# Patient Record
Sex: Male | Born: 1955 | Race: White | Hispanic: No | Smoking: Never smoker
Health system: Southern US, Community
[De-identification: ages and names within clinical notes are randomized; demographics above are authoritative.]

## PROBLEM LIST (undated history)

## (undated) DIAGNOSIS — I10 Essential (primary) hypertension: Secondary | ICD-10-CM

## (undated) DIAGNOSIS — I619 Nontraumatic intracerebral hemorrhage, unspecified: Secondary | ICD-10-CM

## (undated) HISTORY — PX: APPENDECTOMY: SHX54

## (undated) HISTORY — PX: HERNIA REPAIR: SHX51

## (undated) HISTORY — PX: BURR HOLE: SHX908

---

## 1997-05-10 DIAGNOSIS — I639 Cerebral infarction, unspecified: Secondary | ICD-10-CM

## 1997-05-10 HISTORY — DX: Cerebral infarction, unspecified: I63.9

## 1998-01-04 ENCOUNTER — Inpatient Hospital Stay (HOSPITAL_COMMUNITY): Admission: AD | Admit: 1998-01-04 | Discharge: 1998-01-14 | Payer: Self-pay | Admitting: Emergency Medicine

## 1998-01-10 ENCOUNTER — Encounter: Payer: Self-pay | Admitting: Neurosurgery

## 1998-01-14 ENCOUNTER — Inpatient Hospital Stay (HOSPITAL_COMMUNITY)
Admission: RE | Admit: 1998-01-14 | Discharge: 1998-01-24 | Payer: Self-pay | Admitting: Physical Medicine and Rehabilitation

## 1998-01-17 ENCOUNTER — Encounter: Payer: Self-pay | Admitting: Physical Medicine and Rehabilitation

## 1998-01-28 ENCOUNTER — Encounter
Admission: RE | Admit: 1998-01-28 | Discharge: 1998-04-28 | Payer: Self-pay | Admitting: Physical Medicine and Rehabilitation

## 1999-10-23 ENCOUNTER — Ambulatory Visit (HOSPITAL_COMMUNITY): Admission: RE | Admit: 1999-10-23 | Discharge: 1999-10-23 | Payer: Self-pay | Admitting: *Deleted

## 1999-10-23 ENCOUNTER — Encounter: Payer: Self-pay | Admitting: *Deleted

## 2000-04-03 ENCOUNTER — Encounter: Payer: Self-pay | Admitting: *Deleted

## 2000-04-03 ENCOUNTER — Ambulatory Visit (HOSPITAL_COMMUNITY): Admission: RE | Admit: 2000-04-03 | Discharge: 2000-04-03 | Payer: Self-pay | Admitting: *Deleted

## 2001-01-18 ENCOUNTER — Emergency Department (HOSPITAL_COMMUNITY): Admission: EM | Admit: 2001-01-18 | Discharge: 2001-01-18 | Payer: Self-pay | Admitting: Emergency Medicine

## 2001-01-18 ENCOUNTER — Encounter: Payer: Self-pay | Admitting: Emergency Medicine

## 2002-09-04 ENCOUNTER — Inpatient Hospital Stay (HOSPITAL_COMMUNITY): Admission: EM | Admit: 2002-09-04 | Discharge: 2002-09-05 | Payer: Self-pay

## 2002-09-04 ENCOUNTER — Encounter (INDEPENDENT_AMBULATORY_CARE_PROVIDER_SITE_OTHER): Payer: Self-pay | Admitting: *Deleted

## 2003-01-17 ENCOUNTER — Encounter: Payer: Self-pay | Admitting: Family Medicine

## 2003-01-17 ENCOUNTER — Encounter: Admission: RE | Admit: 2003-01-17 | Discharge: 2003-01-17 | Payer: Self-pay | Admitting: Family Medicine

## 2003-06-28 ENCOUNTER — Ambulatory Visit (HOSPITAL_BASED_OUTPATIENT_CLINIC_OR_DEPARTMENT_OTHER): Admission: RE | Admit: 2003-06-28 | Discharge: 2003-06-28 | Payer: Self-pay | Admitting: General Surgery

## 2003-06-28 ENCOUNTER — Ambulatory Visit (HOSPITAL_COMMUNITY): Admission: RE | Admit: 2003-06-28 | Discharge: 2003-06-28 | Payer: Self-pay | Admitting: General Surgery

## 2007-05-19 ENCOUNTER — Encounter: Admission: RE | Admit: 2007-05-19 | Discharge: 2007-05-19 | Payer: Self-pay | Admitting: Family Medicine

## 2007-05-19 IMAGING — CT CT HEAD W/O CM
1 series · 16 of 30 positions shown, 20 images · IV contrast (agent unspecified)
Comparison: None.

CLINICAL DATA: Headaches.  Hypertension.  Prior cerebral hemorrhage in [NA].
 HEAD CT WITHOUT CONTRAST:
TECHNIQUE: Contiguous axial images were obtained from the base of the skull through the vertex according to standard protocol without contrast.

[Series 32: 3d filtered head · axial · 0.49mm/px · z∈[+0,+145]mm · 16 of 32 slices shown, 20 images]
[im 2/32  brain]
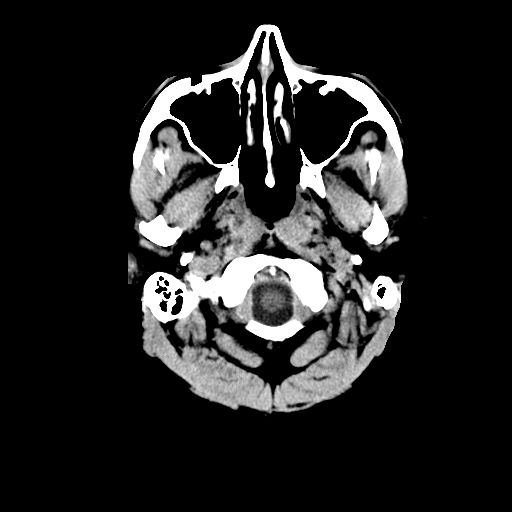
[im 2/32  bone]
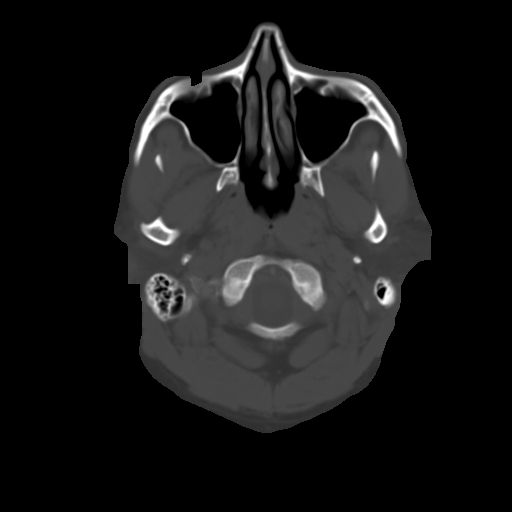
[im 4/32  brain]
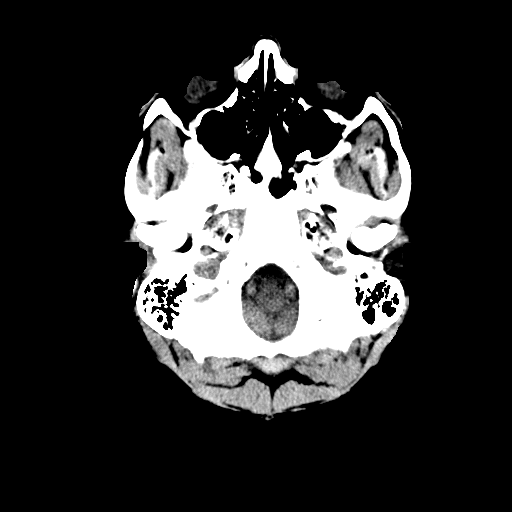
[im 6/32  brain]
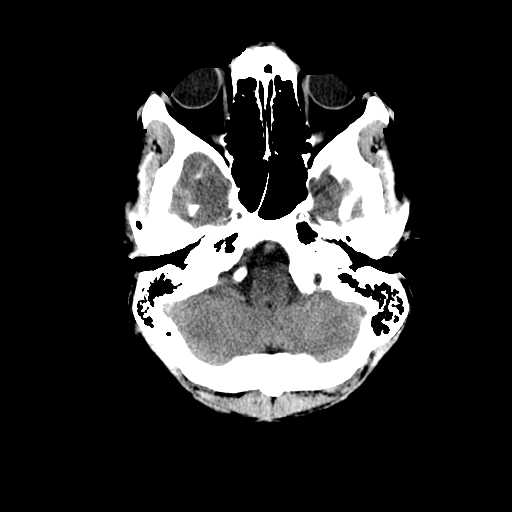
[im 8/32  brain]
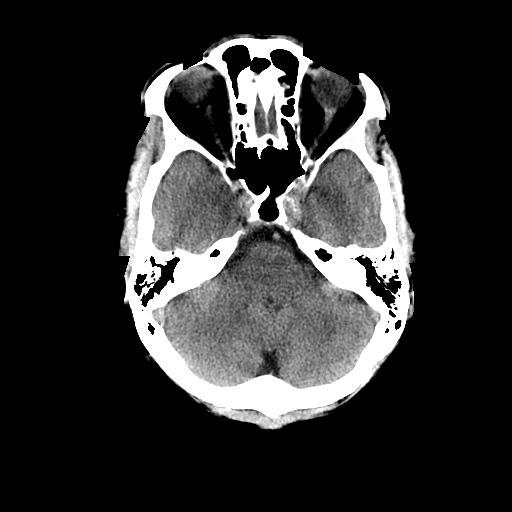
[im 9/32  brain]
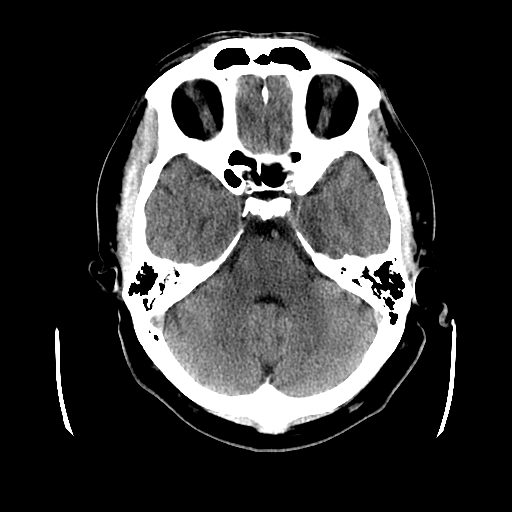
[im 9/32  bone]
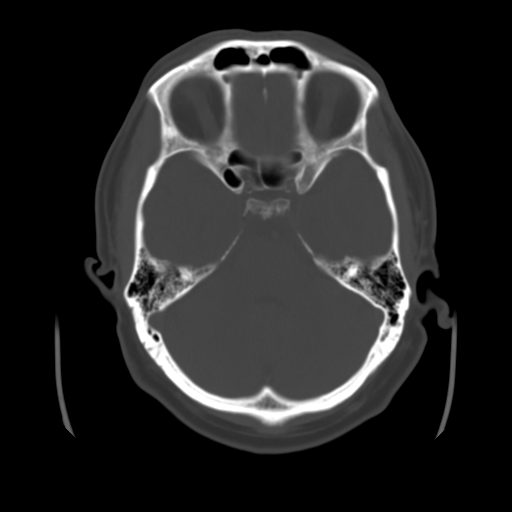
[im 11/32  brain]
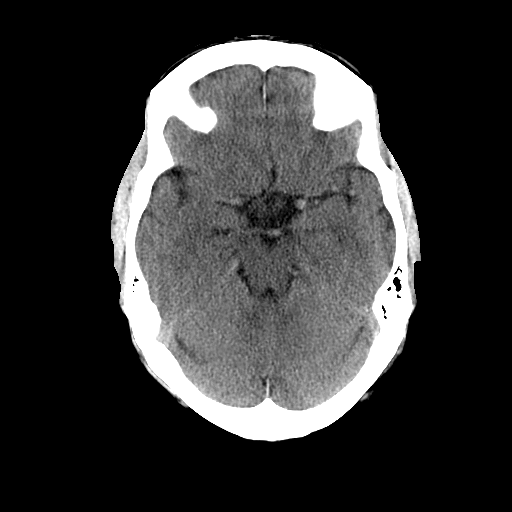
[im 13/32  brain]
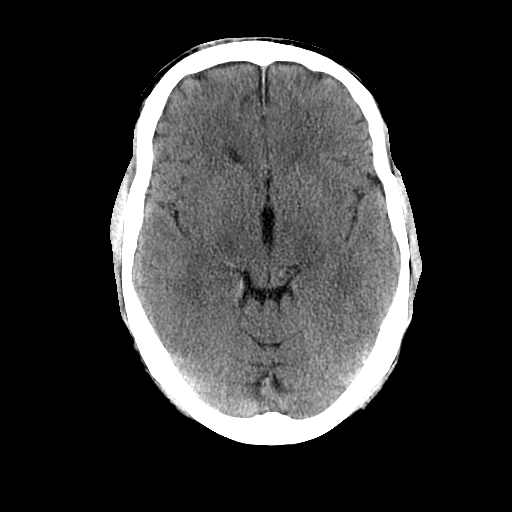
[im 15/32  brain]
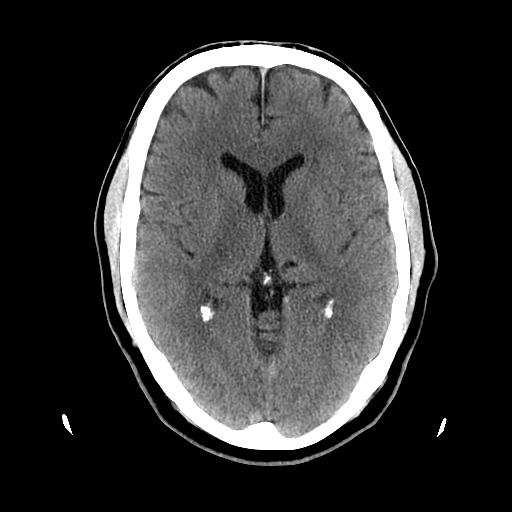
[im 17/32  brain]
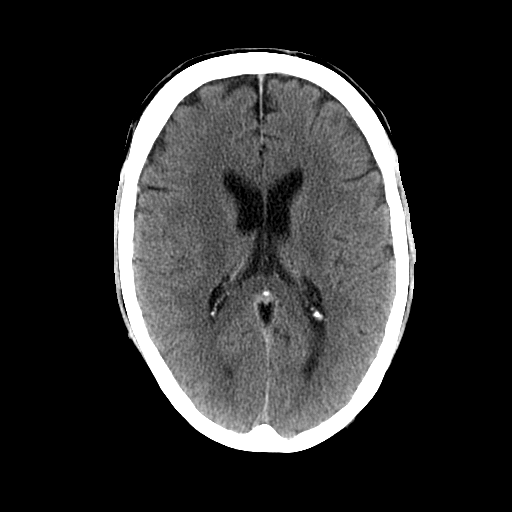
[im 17/32  bone]
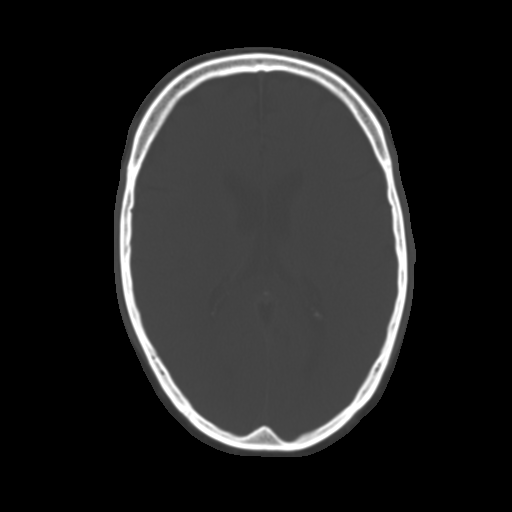
[im 19/32  brain]
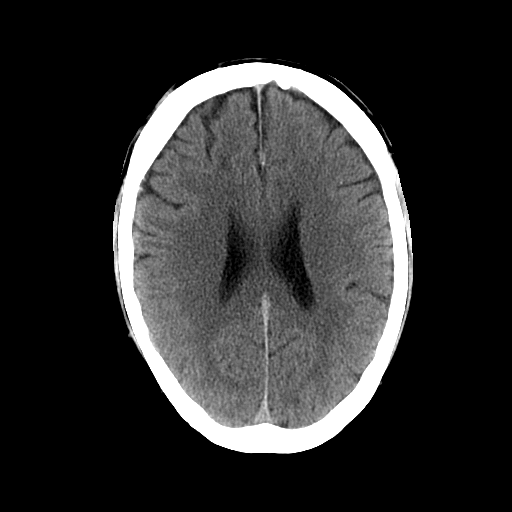
[im 21/32  brain]
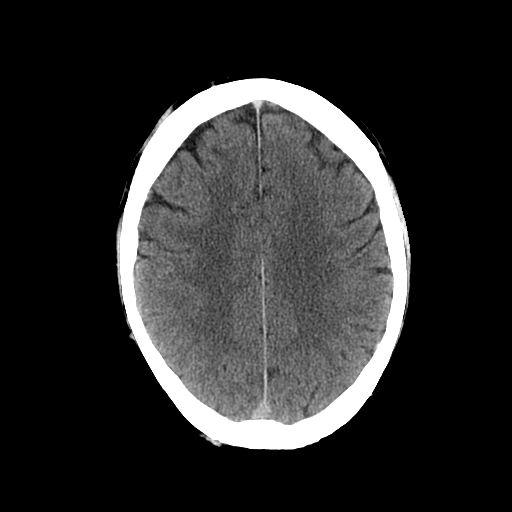
[im 23/32  brain]
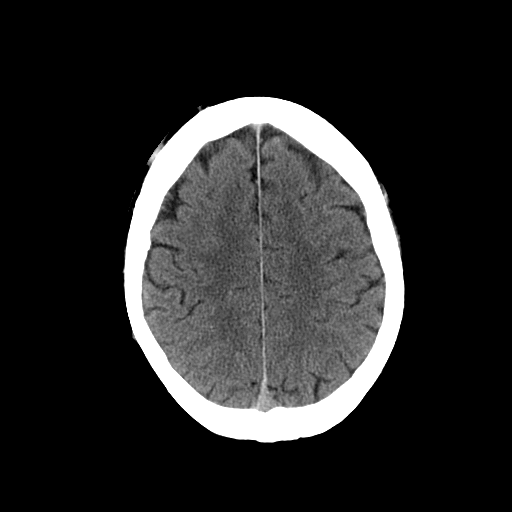
[im 24/32  brain]
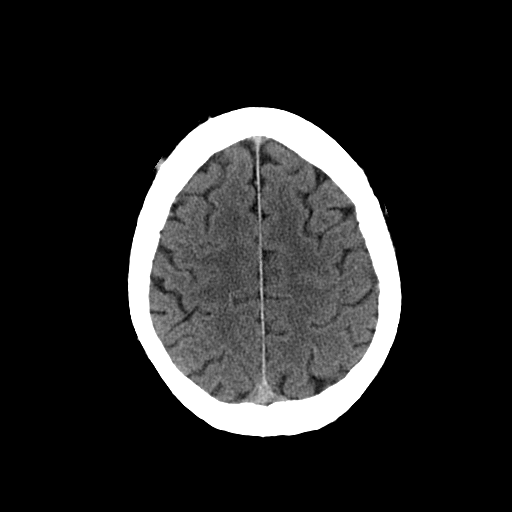
[im 24/32  bone]
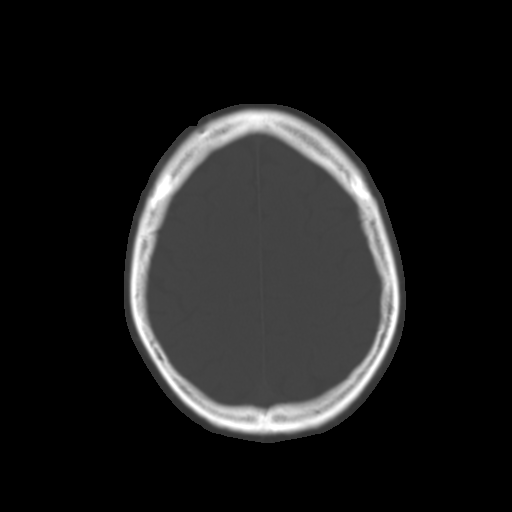
[im 26/32  brain]
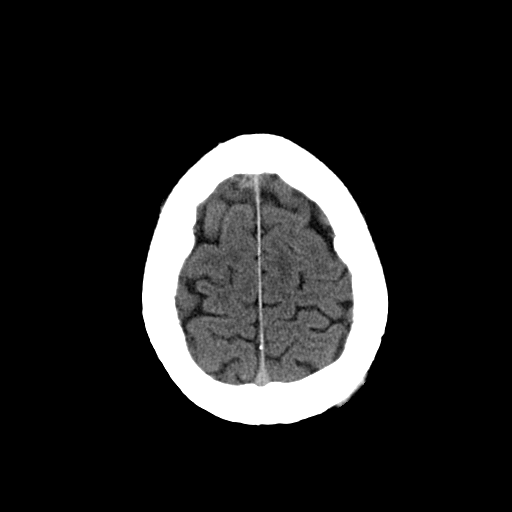
[im 28/32  brain]
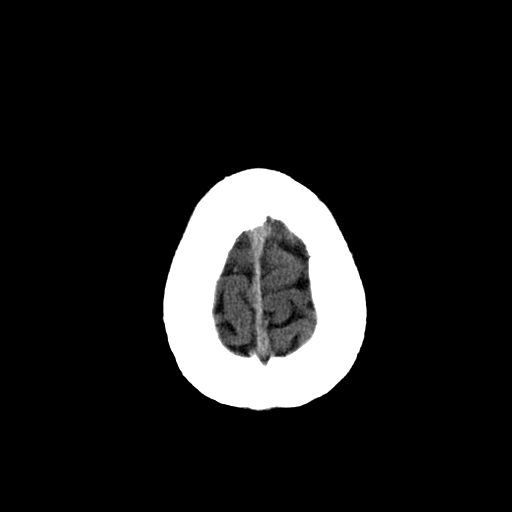
[im 30/32  brain]
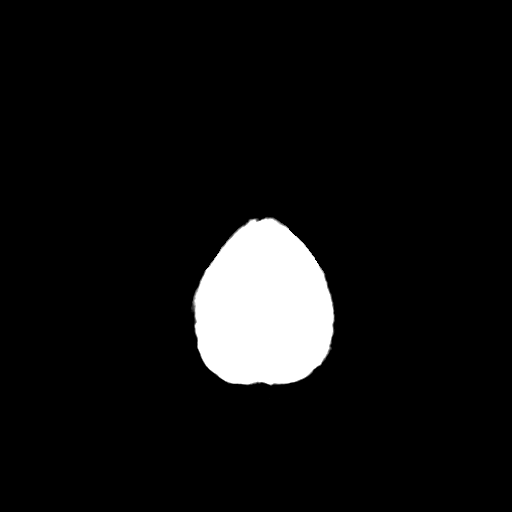

[16 of 30 positions shown; findings below may reference images not displayed]

FINDINGS: The ventricular system is normal in size and configuration, and the septum is in a normal midline position.  The fourth ventricle and basilar cisterns appear normal.  No blood, edema, or mass effect is seen.  Previously noted low attenuation in the left thalamus is again noted, as was described in a dictation from a CT brain can of [DATE], which is not available.  On bone window images, no acute bony abnormality is seen. The paranasal sinuses are clear.
IMPRESSION: No acute intracranial abnormality.

## 2007-08-25 ENCOUNTER — Ambulatory Visit: Payer: Self-pay | Admitting: Gastroenterology

## 2007-09-06 ENCOUNTER — Telehealth: Payer: Self-pay | Admitting: Gastroenterology

## 2007-09-08 ENCOUNTER — Ambulatory Visit: Payer: Self-pay | Admitting: Gastroenterology

## 2007-09-08 ENCOUNTER — Encounter: Payer: Self-pay | Admitting: Gastroenterology

## 2007-09-14 ENCOUNTER — Encounter: Payer: Self-pay | Admitting: Gastroenterology

## 2007-09-22 ENCOUNTER — Telehealth: Payer: Self-pay | Admitting: Gastroenterology

## 2008-02-28 ENCOUNTER — Telehealth: Payer: Self-pay | Admitting: Gastroenterology

## 2008-03-11 ENCOUNTER — Telehealth: Payer: Self-pay | Admitting: Gastroenterology

## 2008-03-25 ENCOUNTER — Telehealth: Payer: Self-pay | Admitting: Gastroenterology

## 2008-04-01 ENCOUNTER — Encounter: Payer: Self-pay | Admitting: Gastroenterology

## 2008-04-01 ENCOUNTER — Ambulatory Visit: Payer: Self-pay | Admitting: Gastroenterology

## 2008-04-07 ENCOUNTER — Encounter: Payer: Self-pay | Admitting: Gastroenterology

## 2008-10-24 ENCOUNTER — Telehealth: Payer: Self-pay | Admitting: Gastroenterology

## 2008-12-16 ENCOUNTER — Telehealth: Payer: Self-pay | Admitting: Gastroenterology

## 2008-12-23 ENCOUNTER — Telehealth: Payer: Self-pay | Admitting: Gastroenterology

## 2010-09-25 NOTE — H&P (Signed)
NAME:  Blake Sullivan, Blake Sullivan                       ACCOUNT NO.:  [id number]   MEDICAL RECORD NO.:  [id number]                   PATIENT TYPE:  EMS   LOCATION:  [facility]                                 FACILITY:  [facility]   PHYSICIAN:  Gabrielle Dare. Janee Morn, M.D.             DATE OF BIRTH:  08/10/1955   DATE OF ADMISSION:  09/04/2002  DATE OF DISCHARGE:                                HISTORY & PHYSICAL   ADMISSION DIAGNOSIS:  Acute appendicitis.   HISTORY OF PRESENT ILLNESS:  The patient is a 55 year old male with a  history of thalamic subarachnoid hemorrhage in 1999, who developed some  pelvic abdominal pain starting yesterday in the morning and gradually  increased throughout the day.  He was able to eat some yesterday with no  problems, but the pain became so severe around midnight he could no longer  sleep persisting and he came to the emergency room for evaluation.  Workup  in the emergency department included a CT scan with acute appendicitis.  Currently, the patient is still complaining of severe right lower quadrant  pain, but denying any change in his neurologic baseline.   PAST MEDICAL HISTORY:  1. 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  2. Hypertension.   PAST SURGICAL HISTORY:  Intracerebral drain placement by Dr. Phoebe Perch during  the acute phase of his hemorrhagic event, but no prolonged shunt was  required and no drain remained.   FAMILY HISTORY:  Maternal grandmother has lung CA.  Denies any other  history.   MEDICATIONS:  1. Altace 5 mg q.d.  2. Xanax 2.5 mg p.o. q.d.  3. Neurontin 900 mg t.i.d. to q.i.d.   ALLERGIES:  VICODIN causes skin crawling.   REVIEW OF SYMPTOMS:  GENERAL:  He felt thirsty in the middle of the week.  PULMONARY:  Without complaints.  CARDIOVASCULAR:  Without complaints.  GASTROINTESTINAL:  See history of present illness.  NEUROLOGIC:  He  complains of some residual pain in his right arm and some sensation changes  in  the right side of his face which have been stable for the past several  years, status post his subarachnoid hemorrhage.   PHYSICAL EXAMINATION:  VITAL SIGNS:  Blood pressure 134/85, respirations 16,  pulse 68, temperature 98.2.  GENERAL:  Awake, alert, in no acute distress.  Pupils equal round and  reactive to light.  NECK:  Supple without palpable adenopathy.  LUNGS:  Clear to auscultation bilaterally.  HEART:  Regular rate and rhythm and his point of maximal impulse is palpable  in the left chest.  ABDOMEN:  Tenderness to palpation across the lower midline, especially in  the right lower quadrant with voluntary guarding and peritoneal signs.  He  has an equivocal Rovsing sign.  GROIN:  Demonstrates a spontaneously reducing right inguinal hernia.  There  is no left inguinal hernia.   LABORATORY DATA AND X-RAY FINDINGS:  Urinalysis was negative for  acute  infection.  White blood cell count 10.3, hemoglobin 15.5, hematocrit 46.2.  Electrolyte panel was also performed in the emergency department which was  within normal limits including a lipase of 22 and normal liver function  tests.   IMPRESSION:  Acute appendicitis.   PLAN:  Proceed to the operating room for appendectomy.  The procedure  including its risks and benefits were explained to the patient in full.  He  is eager to proceed with the operation and in the interim, we will give him  some IV fluids and IV antibiotics.                                               Gabrielle Dare Janee Morn, M.D.    BET/MEDQ  D:  09/04/2002  T:  09/04/2002  Job:  [id number]

## 2010-09-25 NOTE — Op Note (Signed)
NAME:  Blake Sullivan, Blake Sullivan                       ACCOUNT NO.:  [id number]   MEDICAL RECORD NO.:  [id number]                   PATIENT TYPE:  INP   LOCATION:  1827                                 FACILITY:  [facility]   PHYSICIAN:  Gabrielle Dare. Janee Morn, M.D.             DATE OF BIRTH:  26-Feb-1956   DATE OF PROCEDURE:  09/04/2002  DATE OF DISCHARGE:                                 OPERATIVE REPORT   PREOPERATIVE DIAGNOSIS:  Acute appendicitis.   POSTOPERATIVE DIAGNOSIS:  Acute appendicitis.   PROCEDURE:  Laparoscopic appendectomy.   SURGEON:  Gabrielle Dare. Janee Morn, M.D.   ANESTHESIA:  General.   COMPLICATIONS:  None.   CLINICAL HISTORY:  The patient is a 55 year old male with a history of a  thalamic subarachnoid hemorrhage in 1999, who has nearly completely  recovered from that but developed increasing abdominal pain starting  yesterday morning.  Workup and evaluation in the emergency department was  consistent with acute appendicitis, including CT scan demonstrating this  finding, and he was brought to the operating room for an emergent  appendectomy.   DESCRIPTION OF PROCEDURE:  After informed consent was obtained, the patient  was brought to the operating room.  He had received intravenous antibiotics.  General anesthesia was administered.  His abdomen was prepped and draped in  a sterile fashion.  A curvilinear infraumbilical incision was made, the  subcutaneous tissues were dissected down revealing the external fascia,  which was incised sharply.  The peritoneal cavity was then entered with  great care under direct vision.  Subsequently a 0 Vicryl pursestring suture  was placed around the fascial opening and the Hasson trocar was inserted  into the abdomen and the abdomen was insufflated with carbon dioxide in the  standard fashion and the laparoscopic camera was inserted and the abdomen  was explored, revealing some inflammation visible around the base of the  appendix.   Subsequently a 12 mm lower midline trocar was placed and a 5 mm  right upper quadrant trocar was placed, both under direct vision.  Note that  0.25% Marcaine with epinephrine was used for local anesthetic at all port  sites.  Subsequently the tip of the appendix was located.  The appendix was  extremely inflamed with some small patchy areas of necrosis but was not  perforated, and there was no significant fluid in the abdomen, only a very  small amount of cloudy fluid.  The appendix was retracted superiorly and it  was dissected free from some retroperitoneal attachments along the bottom of  the right gutter, and subsequently the mesoappendix was dissected with a  nice window and the mesoappendix was divided with the vascular load on the  endoscopic GIA stapler.  The mesoappendix was quite long, and this appendix  was fairly long.  Three loads were actually required.  Once this was  accomplished, the base of the appendix, which was healthy, was divided with  the  GIA stapler with the blue tissue load.  Once this was accomplished, the  base of the cecum was inspected and there was good integrity of the staple  line.  No bleeding was noted from the area of the mesoappendix.  The  appendix itself was placed in an EndoCatch bag and removed through the lower  midline incision site.  At this time the abdomen was copiously irrigated and  rechecked for any evidence of bleeding or other problems, and none were  noted.  The trocars were removed under direct vision and the  pneumoperitoneum was released.  The umbilical fascia was closed by tying the  pursestring Vicryl suture.  All three incisions were copiously irrigated and  subsequently  closed with a running 4-0 Vicryl subcuticular stitch.  Sponge, needle, and  instrument counts were all correct.  Benzoin and Steri-Strips and sterile  dressings were applied.  The patient tolerated the procedure well without  any apparent complication, was taken to  the recovery room in stable  condition.                                               Gabrielle Dare Janee Morn, M.D.    BET/MEDQ  D:  09/04/2002  T:  09/05/2002  Job:  [id number]

## 2011-03-19 ENCOUNTER — Encounter: Payer: Self-pay | Admitting: Gastroenterology

## 2012-01-28 ENCOUNTER — Encounter: Payer: Self-pay | Admitting: Gastroenterology

## 2014-09-03 ENCOUNTER — Encounter: Payer: Self-pay | Admitting: Gastroenterology

## 2019-03-23 ENCOUNTER — Emergency Department (HOSPITAL_COMMUNITY): Payer: Medicare Other

## 2019-03-23 ENCOUNTER — Encounter (HOSPITAL_COMMUNITY): Payer: Self-pay | Admitting: Emergency Medicine

## 2019-03-23 ENCOUNTER — Emergency Department (HOSPITAL_COMMUNITY)
Admission: EM | Admit: 2019-03-23 | Discharge: 2019-03-24 | Disposition: A | Discharge status: Home / Self Care | Payer: Medicare Other | Attending: Emergency Medicine | Admitting: Emergency Medicine

## 2019-03-23 DIAGNOSIS — Z79899 Other long term (current) drug therapy: Secondary | ICD-10-CM | POA: Diagnosis not present

## 2019-03-23 DIAGNOSIS — J9811 Atelectasis: Secondary | ICD-10-CM | POA: Insufficient documentation

## 2019-03-23 DIAGNOSIS — R42 Dizziness and giddiness: Secondary | ICD-10-CM

## 2019-03-23 DIAGNOSIS — Q282 Arteriovenous malformation of cerebral vessels: Secondary | ICD-10-CM | POA: Insufficient documentation

## 2019-03-23 DIAGNOSIS — Q2546 Tortuous aortic arch: Secondary | ICD-10-CM | POA: Diagnosis not present

## 2019-03-23 DIAGNOSIS — I10 Essential (primary) hypertension: Secondary | ICD-10-CM | POA: Insufficient documentation

## 2019-03-23 DIAGNOSIS — F10920 Alcohol use, unspecified with intoxication, uncomplicated: Secondary | ICD-10-CM | POA: Diagnosis not present

## 2019-03-23 DIAGNOSIS — F1290 Cannabis use, unspecified, uncomplicated: Secondary | ICD-10-CM | POA: Insufficient documentation

## 2019-03-23 DIAGNOSIS — Z8673 Personal history of transient ischemic attack (TIA), and cerebral infarction without residual deficits: Secondary | ICD-10-CM | POA: Insufficient documentation

## 2019-03-23 DIAGNOSIS — R519 Headache, unspecified: Secondary | ICD-10-CM

## 2019-03-23 HISTORY — DX: Essential (primary) hypertension: I10

## 2019-03-23 HISTORY — DX: Nontraumatic intracerebral hemorrhage, unspecified: I61.9

## 2019-03-23 LAB — I-STAT CHEM 8, ED
BUN: 21 mg/dL (ref 8–23)
Calcium, Ion: 1.21 mmol/L (ref 1.15–1.40)
Chloride: 104 mmol/L (ref 98–111)
Creatinine, Ser: 1.2 mg/dL (ref 0.61–1.24)
Glucose, Bld: 93 mg/dL (ref 70–99)
HCT: 45 % (ref 39.0–52.0)
Hemoglobin: 15.3 g/dL (ref 13.0–17.0)
Potassium: 4.5 mmol/L (ref 3.5–5.1)
Sodium: 140 mmol/L (ref 135–145)
TCO2: 25 mmol/L (ref 22–32)

## 2019-03-23 LAB — COMPREHENSIVE METABOLIC PANEL
ALT: 25 U/L (ref 0–44)
AST: 32 U/L (ref 15–41)
Albumin: 4.1 g/dL (ref 3.5–5.0)
Alkaline Phosphatase: 69 U/L (ref 38–126)
Anion gap: 9 (ref 5–15)
BUN: 18 mg/dL (ref 8–23)
CO2: 23 mmol/L (ref 22–32)
Calcium: 9.2 mg/dL (ref 8.9–10.3)
Chloride: 102 mmol/L (ref 98–111)
Creatinine, Ser: 1.27 mg/dL — ABNORMAL HIGH (ref 0.61–1.24)
GFR calc Af Amer: >60 mL/min (ref >60)
GFR calc non Af Amer: 60 mL/min — ABNORMAL LOW (ref >60)
Glucose, Bld: 97 mg/dL (ref 70–99)
Potassium: 4.5 mmol/L (ref 3.5–5.1)
Sodium: 134 mmol/L — ABNORMAL LOW (ref 135–145)
Total Bilirubin: 0.9 mg/dL (ref 0.3–1.2)
Total Protein: 6.3 g/dL — ABNORMAL LOW (ref 6.5–8.1)

## 2019-03-23 LAB — DIFFERENTIAL
Abs Immature Granulocytes: 0.05 10*3/uL (ref 0.00–0.07)
Basophils Absolute: 0.1 10*3/uL (ref 0.0–0.1)
Basophils Relative: 1 %
Eosinophils Absolute: 0.1 10*3/uL (ref 0.0–0.5)
Eosinophils Relative: 1 %
Immature Granulocytes: 1 %
Lymphocytes Relative: 24 %
Lymphs Abs: 1.6 10*3/uL (ref 0.7–4.0)
Monocytes Absolute: 0.7 10*3/uL (ref 0.1–1.0)
Monocytes Relative: 10 %
Neutro Abs: 4.3 10*3/uL (ref 1.7–7.7)
Neutrophils Relative %: 63 %

## 2019-03-23 LAB — RAPID URINE DRUG SCREEN, HOSP PERFORMED
Amphetamines: NOT DETECTED
Barbiturates: NOT DETECTED
Benzodiazepines: NOT DETECTED
Cocaine: NOT DETECTED
Opiates: NOT DETECTED
Tetrahydrocannabinol: POSITIVE — AB

## 2019-03-23 LAB — TROPONIN I (HIGH SENSITIVITY): Troponin I (High Sensitivity): 3 ng/L (ref <18)

## 2019-03-23 LAB — APTT: aPTT: 28 seconds (ref 24–36)

## 2019-03-23 LAB — ETHANOL: Alcohol, Ethyl (B): <10 mg/dL (ref <10)

## 2019-03-23 LAB — CBC
HCT: 44.6 % (ref 39.0–52.0)
Hemoglobin: 14.8 g/dL (ref 13.0–17.0)
MCH: 30.8 pg (ref 26.0–34.0)
MCHC: 33.2 g/dL (ref 30.0–36.0)
MCV: 92.9 fL (ref 80.0–100.0)
Platelets: 167 10*3/uL (ref 150–400)
RBC: 4.8 MIL/uL (ref 4.22–5.81)
RDW: 13 % (ref 11.5–15.5)
WBC: 6.8 10*3/uL (ref 4.0–10.5)
nRBC: 0 % (ref 0.0–0.2)

## 2019-03-23 LAB — CBG MONITORING, ED: Glucose-Capillary: 89 mg/dL (ref 70–99)

## 2019-03-23 LAB — PROTIME-INR
INR: 1.1 (ref 0.8–1.2)
Prothrombin Time: 13.9 seconds (ref 11.4–15.2)

## 2019-03-23 IMAGING — CT CT HEAD W/O CM
4 series · 16 of 47 positions shown, 18 images · non-contrast
Comparison: [DATE]

CLINICAL DATA: Dizziness

EXAM:
CT HEAD WITHOUT CONTRAST
TECHNIQUE: Contiguous axial images were obtained from the base of the skull
through the vertex without intravenous contrast.

[Series 3: head without · axial · non-contrast · 0.44mm/px · z∈[-43,+77]mm · 6 of 34 slices shown, 8 images]
[im 5/34  brain]
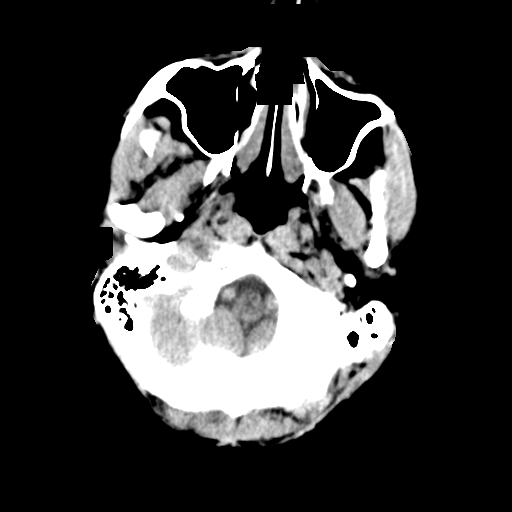
[im 5/34  bone]
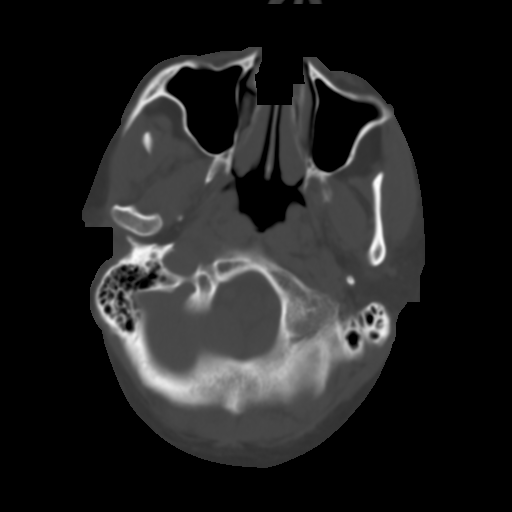
[im 10/34  brain]
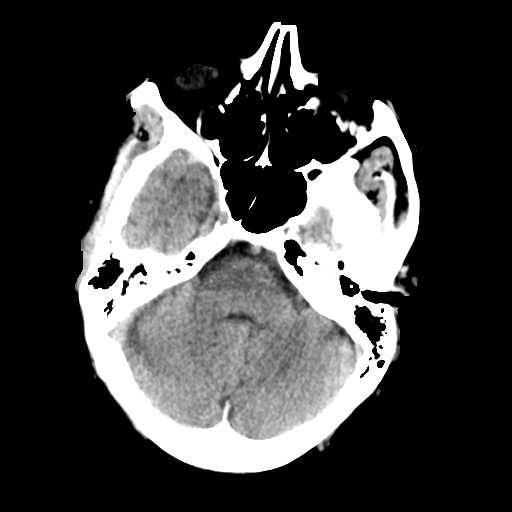
[im 15/34  brain]
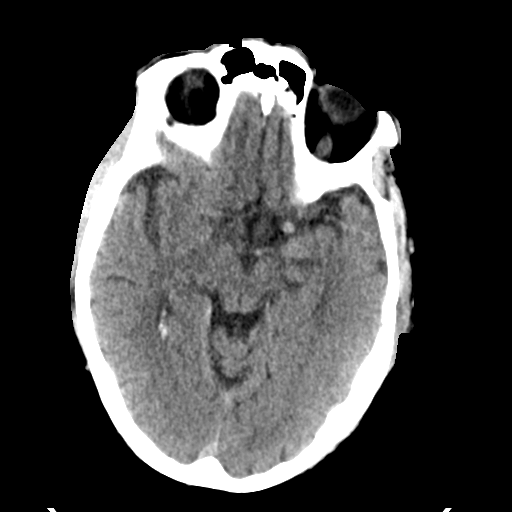
[im 19/34  brain]
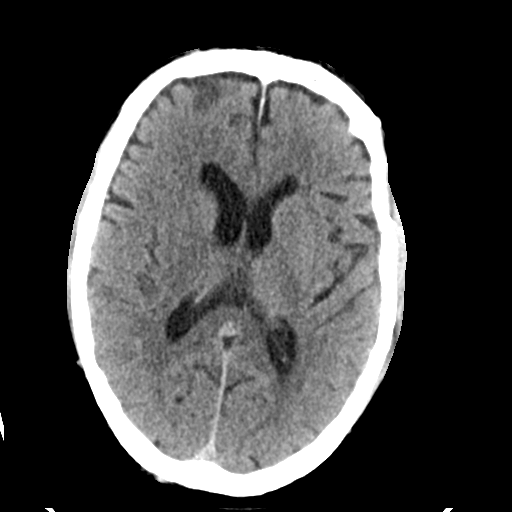
[im 24/34  brain]
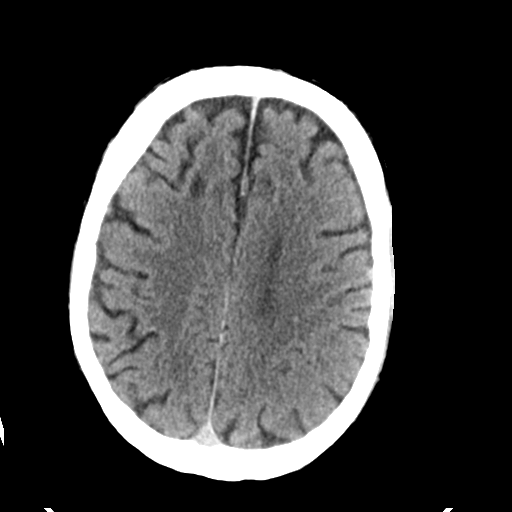
[im 24/34  bone]
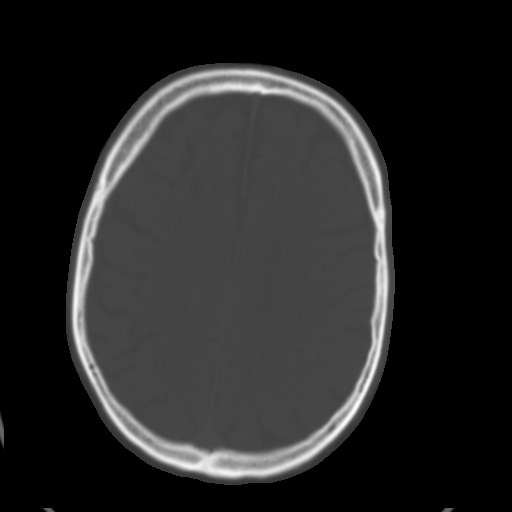
[im 29/34  brain]
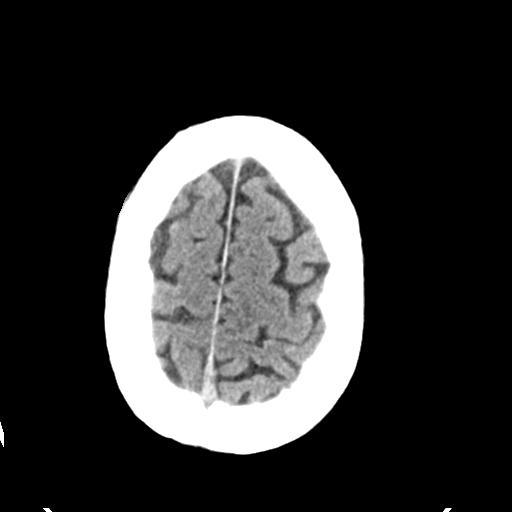

[Series 4: head bone · axial · 0.44mm/px · z∈[-47,+9]mm · 4 of 86 slices shown]
[im 9/86  bone]
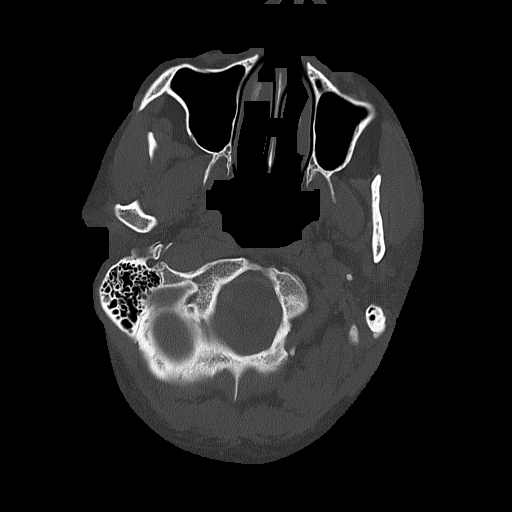
[im 17/86  bone]
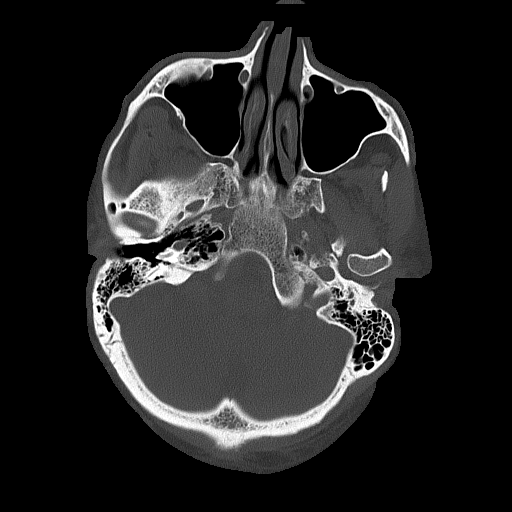
[im 29/86  bone]
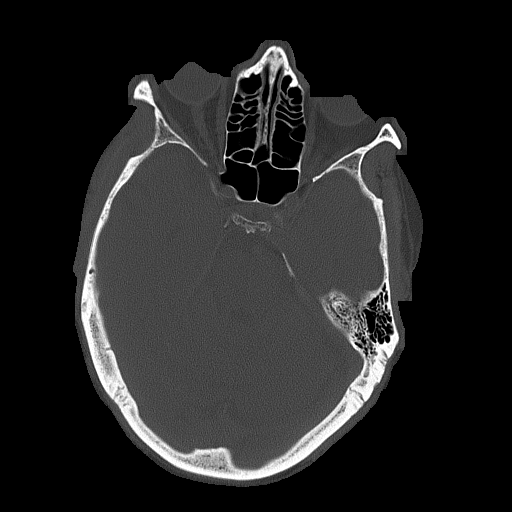
[im 37/86  bone]
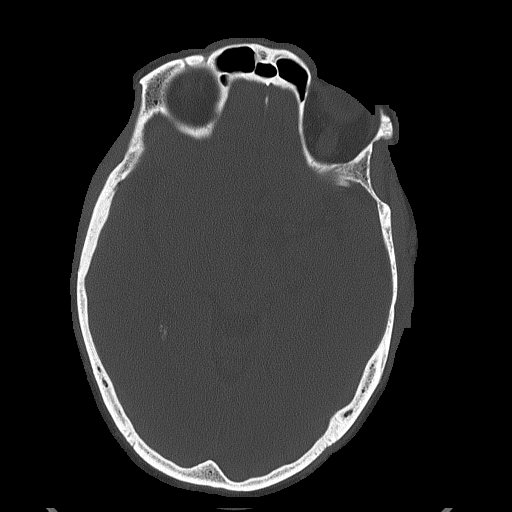

[Series 5: head without cor · coronal · non-contrast · 0.34mm/px · 3 of 80 slices shown]
[im 27/80  brain]
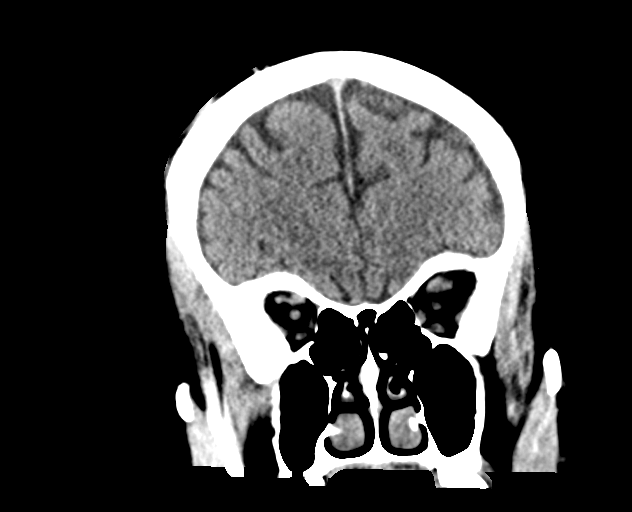
[im 36/80  brain]
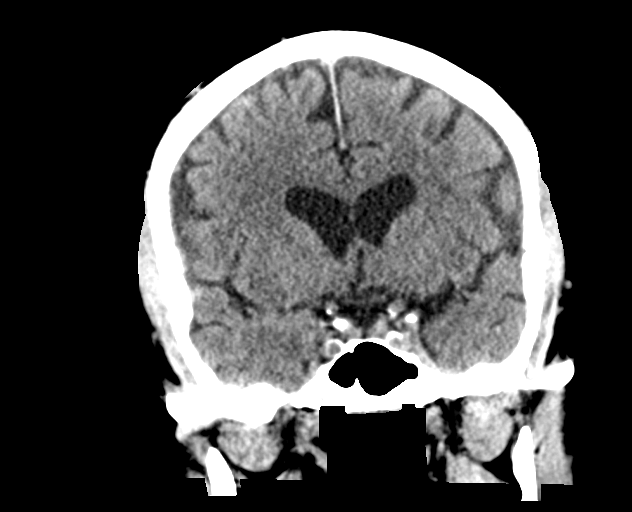
[im 44/80  brain]
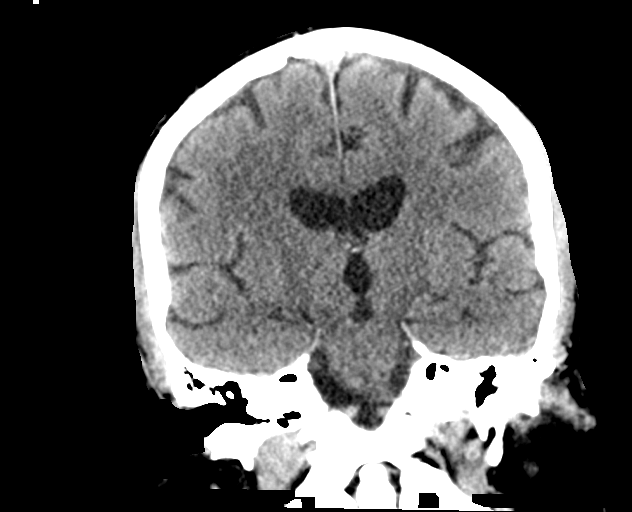

[Series 6: head without sag · sagittal · non-contrast · 0.41mm/px · 3 of 67 slices shown]
[im 23/67  brain]
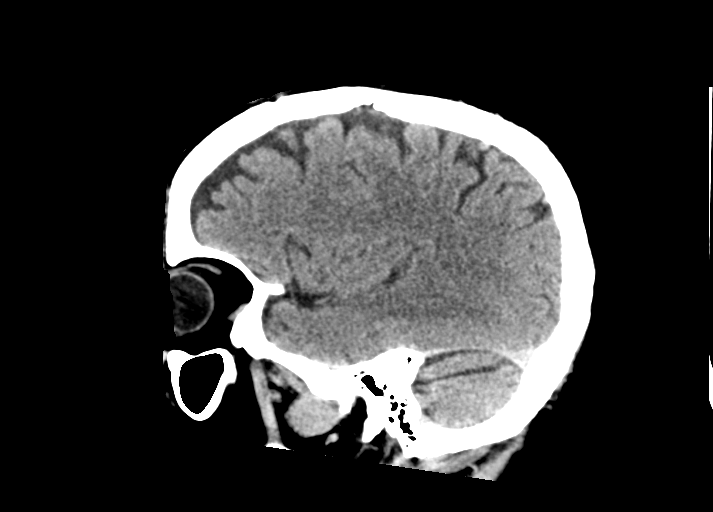
[im 34/67  brain]
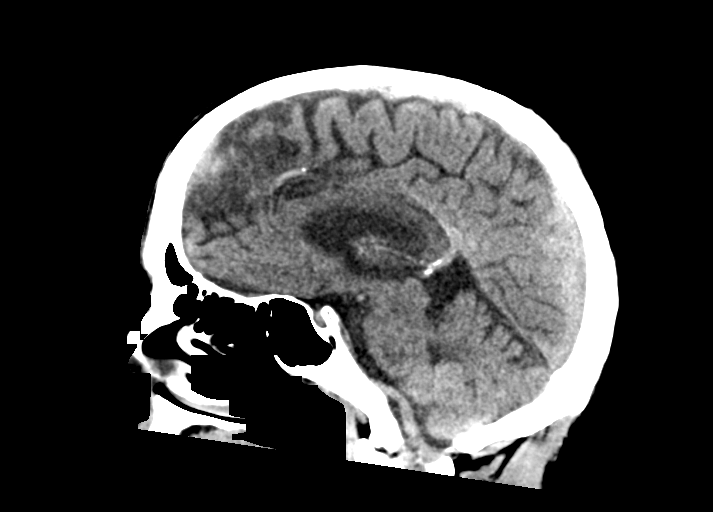
[im 45/67  brain]
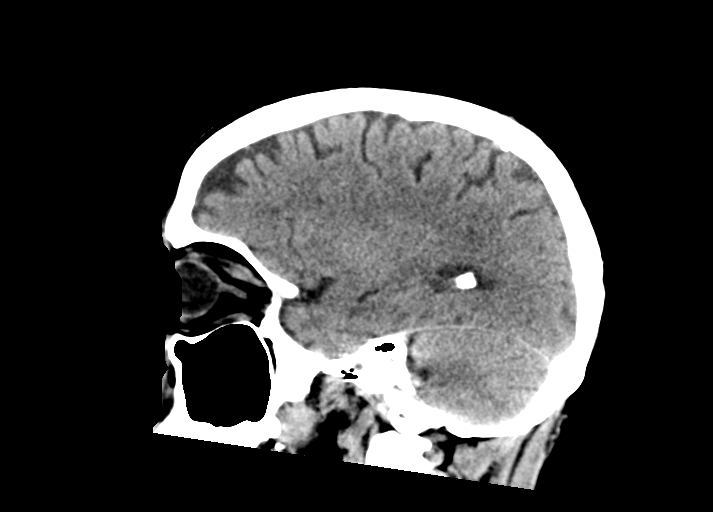

[16 of 47 positions shown; findings below may reference images not displayed]

FINDINGS: Brain: No evidence of acute infarction, hemorrhage, hydrocephalus,
extra-axial collection or mass lesion/mass effect.

Vascular: No hyperdense vessel or unexpected calcification.

Skull: Normal. Negative for fracture or focal lesion.

Sinuses/Orbits: No acute finding.

Other: None.
IMPRESSION: No acute intracranial abnormality noted.

## 2019-03-23 IMAGING — DX DG CHEST 1V PORT
1 series · 1 of 1 positions shown · non-contrast
Comparison: None.

CLINICAL DATA: Sudden onset of dizziness

EXAM:
PORTABLE CHEST 1 VIEW

[chest ap]
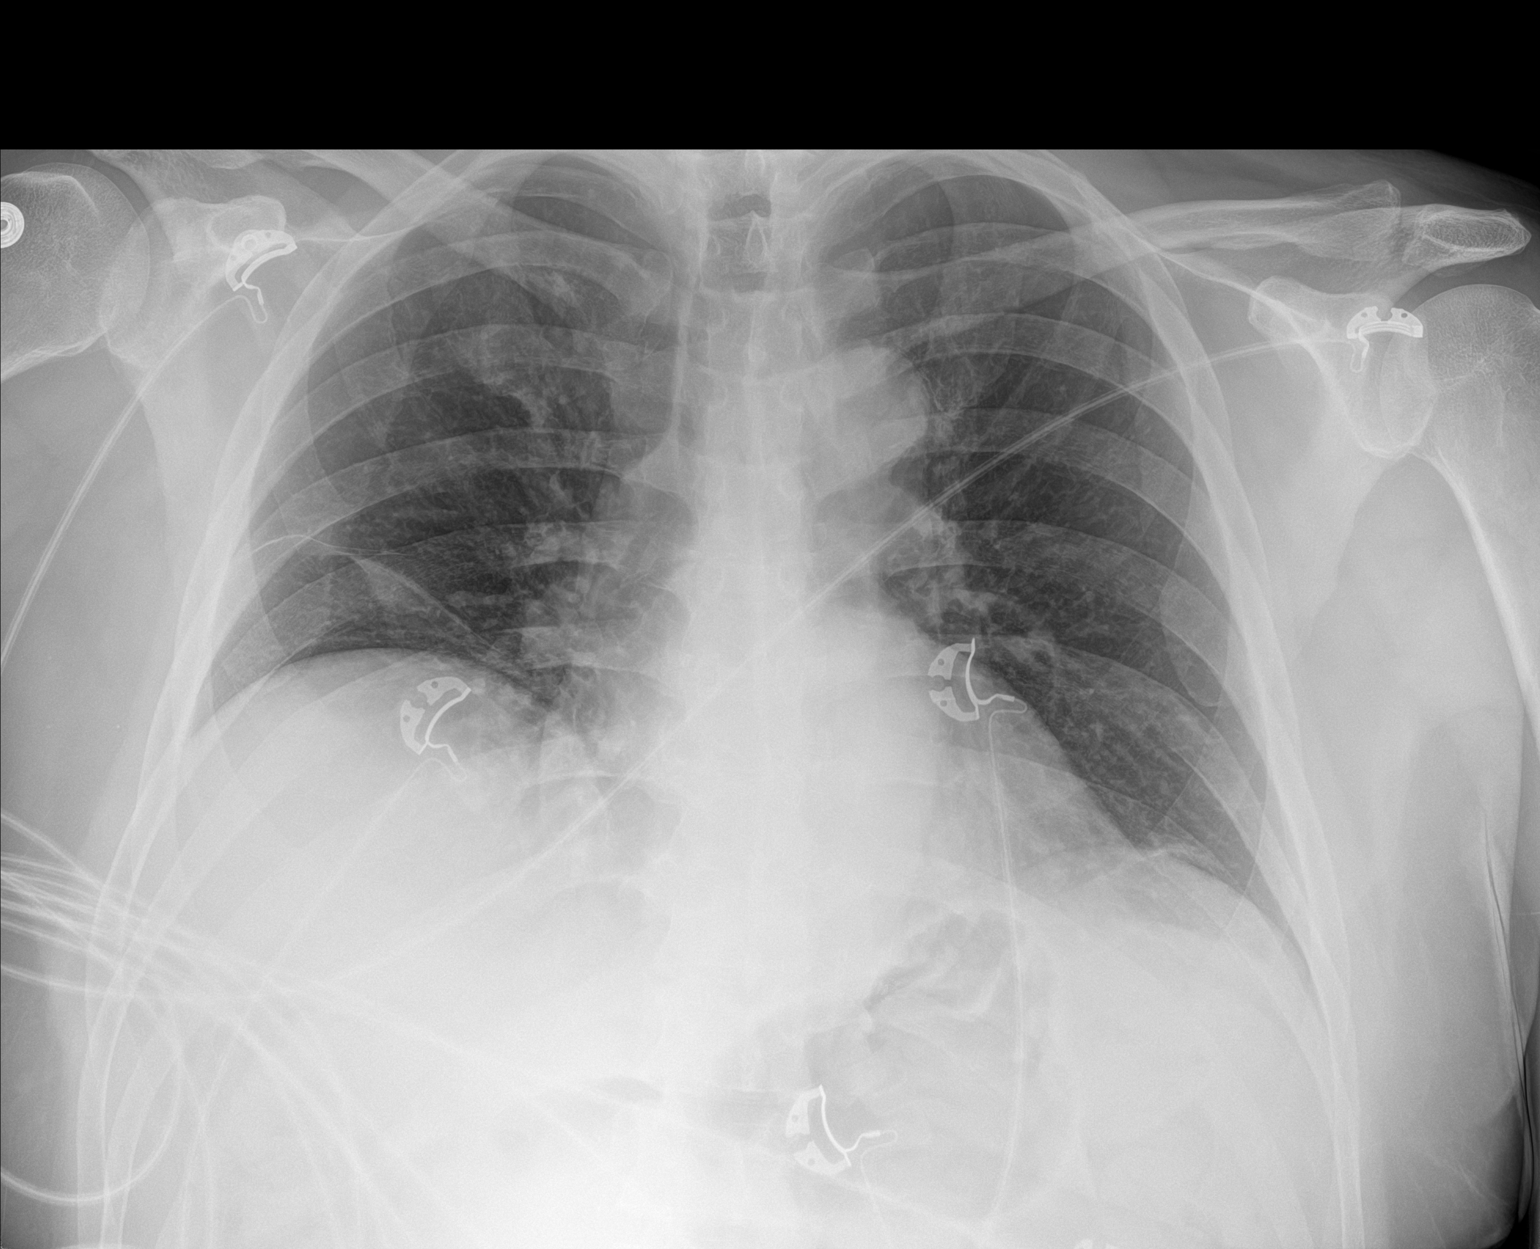

[1 of 1 positions shown; findings below may reference images not displayed]

FINDINGS: Cardiac shadows within normal limits. Thoracic aorta is tortuous.
Mild bibasilar atelectatic changes are seen. No sizable effusion or
focal infiltrate is noted. No bony abnormality is seen.
IMPRESSION: Mild bibasilar atelectasis.

## 2019-03-23 MED ORDER — SODIUM CHLORIDE 0.9% FLUSH: 3.0000 mL | Freq: Once | INTRAVENOUS | Status: DC

## 2019-03-23 MED ORDER — SODIUM CHLORIDE 0.9 % IV BOLUS
1000.0000 mL | Freq: Once | INTRAVENOUS | Status: AC
  Administered 2019-03-23: 1000 mL via INTRAVENOUS

## 2019-03-23 NOTE — ED Provider Notes (Signed)
63 year old male received at sign out from [place] pending imaging. Per her HPI:   "Blake Sullivan is a 63 y.o. male with a past medical history of hemorrhagic stroke in 1999, hypertension presenting to the ED with a chief complaint of dizziness.  Just prior to arrival he was sitting, eating dinner when he had sudden onset of dizziness.  States that the dizziness and discomfort was so severe.  The symptoms gradually improved after he walked over to the couch and lay down.  States that he has had 3-4 subsequent episodes although "not as bad as the first 1."  He does note that he has had similar but less severe episodes throughout the past month.  Symptoms have no specific aggravating or alleviating factor.  He denies headache or chest pain.  He has been compliant with his home medications.  Denies any injuries or falls, numbness in arms or legs, blurry vision, vomiting, fever, changes to activity or appetite."  Physical Exam  BP (!) 143/91   Pulse 74   Temp 97.7 F (36.5 C) (Oral)   Resp 16   Ht 5\' 8"  (1.727 m)   Wt 83.9 kg   SpO2 99%   BMI 28.13 kg/m   Physical Exam Vitals signs and nursing note reviewed.  Constitutional:      Appearance: He is well-developed.  HENT:     Head: Normocephalic and atraumatic.  Neck:     Musculoskeletal: Neck supple.  Pulmonary:     Effort: Pulmonary effort is normal.  Neurological:     Mental Status: He is alert and oriented to person, place, and time.     Cranial Nerves: No cranial nerve deficit.     Comments: Speaks in complete, fluent sentences.   Psychiatric:        Behavior: Behavior normal.     ED Course/Procedures     Procedures  MDM   63 year old male with a history of a hemorrhagic stroke in 1999 received at signout from [place] pending MRI.  Please see her note for further work-up and medical decision making. MRI with no acute findings.    Of note, on my evaluation, the patient does report that he did have a recent increase  in his bupropion from 300 mg to 450 mg daily approximately a couple of weeks ago. Could consider bupropion dosing increase as a cause of his dizziness and worsening headaches.   He is established with neurology and has been encouraged to schedule follow-up appointment as well as follow-up with cardiology.  All questions answered.  ER return precautions given.  Safe for discharge with outpatient follow-up as indicated.      Joanne Gavel, PA-C 40/10/27 2536    Delora Fuel, MD 63/40/34 [phone]

## 2019-03-23 NOTE — Discharge Instructions (Addendum)
Continue your home medications as previously prescribed. Follow-up with your primary care provider and neurologist for further management.  You may need to see a cardiologist for possible cause of your dizziness. Return to the ED if you start to experience worsening symptoms, injuries or falls, numbness in arms or legs, visual changes, or chest pain.

## 2019-03-23 NOTE — ED Provider Notes (Signed)
[facility] EMERGENCY DEPARTMENT Provider Note   CSN: [id number] Arrival date & time: 03/23/19  1935     History   Chief Complaint Chief Complaint  Patient presents with  . Dizziness    HPI Blake Sullivan is a 63 y.o. male with a past medical history of hemorrhagic stroke in 1999, hypertension presenting to the ED with a chief complaint of dizziness.  Just prior to arrival he was sitting, eating dinner when he had sudden onset of dizziness.  States that the dizziness and discomfort was so severe.  The symptoms gradually improved after he walked over to the couch and lay down.  States that he has had 3-4 subsequent episodes although "not as bad as the first 1."  He does note that he has had similar but less severe episodes throughout the past month.  Symptoms have no specific aggravating or alleviating factor.  He denies headache or chest pain.  He has been compliant with his home medications.  Denies any injuries or falls, numbness in arms or legs, blurry vision, vomiting, fever, changes to activity or appetite.     HPI  Past Medical History:  Diagnosis Date  . Hemorrhagic stroke (HCC)   . Hypertension   . Stroke (HCC) 05/10/1997    There are no active problems to display for this patient.   Past Surgical History:  Procedure Laterality Date  . APPENDECTOMY    . BURR HOLE    . HERNIA REPAIR          Home Medications    Prior to Admission medications   Not on File    Family History No family history on file.  Social History Social History   Tobacco Use  . Smoking status: Never Smoker  . Smokeless tobacco: Never Used  Substance Use Topics  . Alcohol use: Yes  . Drug use: Yes    Types: Marijuana     Allergies   Patient has no known allergies.   Review of Systems Review of Systems  Constitutional: Negative for appetite change, chills and fever.  HENT: Negative for ear pain, rhinorrhea, sneezing and sore throat.   Eyes: Negative  for photophobia and visual disturbance.  Respiratory: Negative for cough, chest tightness, shortness of breath and wheezing.   Cardiovascular: Negative for chest pain and palpitations.  Gastrointestinal: Negative for abdominal pain, blood in stool, constipation, diarrhea, nausea and vomiting.  Genitourinary: Negative for dysuria, hematuria and urgency.  Musculoskeletal: Negative for myalgias.  Skin: Negative for rash.  Neurological: Positive for dizziness. Negative for weakness and light-headedness.     Physical Exam Updated Vital Signs BP 134/88   Pulse 73   Temp 97.7 F (36.5 C) (Oral)   Resp 16   Ht 5\' 8"  (1.727 m)   Wt 83.9 kg   SpO2 98%   BMI 28.13 kg/m   Physical Exam Vitals signs and nursing note reviewed.  Constitutional:      General: He is not in acute distress.    Appearance: He is well-developed.  HENT:     Head: Normocephalic and atraumatic.     Nose: Nose normal.  Eyes:     General: No scleral icterus.       Right eye: No discharge.        Left eye: No discharge.     Conjunctiva/sclera: Conjunctivae normal.     Pupils: Pupils are equal, round, and reactive to light.  Neck:     Musculoskeletal: Normal range of motion and  neck supple.  Cardiovascular:     Rate and Rhythm: Normal rate and regular rhythm.     Heart sounds: Normal heart sounds. No murmur. No friction rub. No gallop.   Pulmonary:     Effort: Pulmonary effort is normal. No respiratory distress.     Breath sounds: Normal breath sounds.  Abdominal:     General: Bowel sounds are normal. There is no distension.     Palpations: Abdomen is soft.     Tenderness: There is no abdominal tenderness. There is no guarding.  Musculoskeletal: Normal range of motion.  Skin:    General: Skin is warm and dry.     Findings: No rash.  Neurological:     General: No focal deficit present.     Mental Status: He is alert and oriented to person, place, and time.     Cranial Nerves: No cranial nerve deficit.      Sensory: No sensory deficit.     Motor: No weakness or abnormal muscle tone.     Coordination: Coordination normal.     Comments: Alert, oriented x4.  No facial asymmetry noted.  Equal grip strength bilaterally.  Strength 5/5 in bilateral lower extremities.  No aphasia.  Normal finger-to-nose coordination. Sensation intact to light touch of face, BLE, BUE.      ED Treatments / Results  Labs (all labs ordered are listed, but only abnormal results are displayed) Labs Reviewed  COMPREHENSIVE METABOLIC PANEL - Abnormal; Notable for the following components:      Result Value   Sodium 134 (*)    Creatinine, Ser 1.27 (*)    Total Protein 6.3 (*)    GFR calc non Af Amer 60 (*)    All other components within normal limits  RAPID URINE DRUG SCREEN, HOSP PERFORMED - Abnormal; Notable for the following components:   Tetrahydrocannabinol POSITIVE (*)    All other components within normal limits  PROTIME-INR  APTT  CBC  DIFFERENTIAL  ETHANOL  CBG MONITORING, ED  I-STAT CHEM 8, ED  TROPONIN I (HIGH SENSITIVITY)    EKG None  Radiology Ct Head Wo Contrast  Result Date: 03/23/2019 CLINICAL DATA:  Dizziness EXAM: CT HEAD WITHOUT CONTRAST TECHNIQUE: Contiguous axial images were obtained from the base of the skull through the vertex without intravenous contrast. COMPARISON:  05/19/2007 FINDINGS: Brain: No evidence of acute infarction, hemorrhage, hydrocephalus, extra-axial collection or mass lesion/mass effect. Vascular: No hyperdense vessel or unexpected calcification. Skull: Normal. Negative for fracture or focal lesion. Sinuses/Orbits: No acute finding. Other: None. IMPRESSION: No acute intracranial abnormality noted. Electronically Signed   By: Alcide CleverMark  Lukens M.D.   On: 03/23/2019 20:13   Dg Chest Portable 1 View  Result Date: 03/23/2019 CLINICAL DATA:  Sudden onset of dizziness EXAM: PORTABLE CHEST 1 VIEW COMPARISON:  None. FINDINGS: Cardiac shadows within normal limits. Thoracic aorta is  tortuous. Mild bibasilar atelectatic changes are seen. No sizable effusion or focal infiltrate is noted. No bony abnormality is seen. IMPRESSION: Mild bibasilar atelectasis. Electronically Signed   By: Alcide CleverMark  Lukens M.D.   On: 03/23/2019 22:29    Procedures Procedures (including critical care time)  Medications Ordered in ED Medications  sodium chloride flush (NS) 0.9 % injection 3 mL (has no administration in time range)  sodium chloride 0.9 % bolus 1,000 mL (0 mLs Intravenous Stopped 03/23/19 2155)     Initial Impression / Assessment and Plan / ED Course  I have reviewed the triage vital signs and the nursing notes.  Pertinent labs & imaging results that were available during my care of the patient were reviewed by me and considered in my medical decision making (see chart for details).        63 year old male with past medical history of hemorrhagic stroke in 1999, hypertension presenting to the ED with a chief complaint of dizziness.  Had sudden onset of dizziness prior to arrival which improved spontaneously.  Had subsequent 3-4 episodes although less severe.  Patient is overall a poor historian difficult to obtain what exactly he fell.  He notes dizziness and lightheadedness.  He does admit to alcohol use today as well as daily marijuana use.  He denies any injuries or falls, numbness in arms or legs or vision changes.  He had recent work-up for chest pain at outside ED earlier in the week and was reassuring.  On exam patient is overall well-appearing.  Speaking complete sentences without difficulty.  No deficits to neurological exam noted.  Vital signs are within normal limits.  CT of the head is unremarkable.  Baseline lab work is unremarkable as well.  EKG shows normal sinus rhythm, no ischemic changes.  Due to patient's history, will add troponin, MRI and MRA of the head and neck and reevaluate.  Will give IV fluids in the meantime.  11:30 PM Patient resting comfortably on exam,  tolerating p.o. intake without difficulty.  He will need MRI imaging.  He will be able to be discharged home with cardiology, neurology and PCP follow-up of his imaging is unremarkable. Care handed off to oncoming provider.  Final Clinical Impressions(s) / ED Diagnoses   Final diagnoses:  Dizziness    ED Discharge Orders    None       Delia Heady, PA-C 03/23/19 2333    Gareth Morgan, MD 03/25/19 2223

## 2019-03-23 NOTE — ED Notes (Signed)
Pt stated he felt lightheaded while doing orthostatics.

## 2019-03-23 NOTE — ED Triage Notes (Signed)
BIB EMS from home. Pt reports sudden onset of dizziness lasting approx 10-15 min. No neuro deficits noted at this time. Pt admits to drinking ETOH tonight. States last marijuana use last night. A/OX4.

## 2019-03-24 ENCOUNTER — Emergency Department (HOSPITAL_COMMUNITY): Payer: Medicare Other

## 2019-03-24 ENCOUNTER — Other Ambulatory Visit: Payer: Self-pay

## 2019-03-24 IMAGING — MR MR MRA NECK WO/W CM
5 of 8 series · 41 of 48 positions shown · IV contrast (gadavist)
Comparison: MRA head [DATE] and head CT [DATE]

CLINICAL DATA: Dizziness

EXAM:
MR HEAD WITHOUT CONTRAST
MR CIRCLE OF WILLIS WITHOUT CONTRAST
MRA OF THE NECK WITHOUT AND WITH CONTRAST
TECHNIQUE: Multiplanar, multiecho pulse sequences of the brain, circle of
willis and surrounding structures were obtained without intravenous
contrast. Angiographic images of the neck were obtained using MRA
technique without and with intravenous contrast.
CONTRAST:  8mL GADAVIST GADOBUTROL 1 MMOL/ML IV SOLN

[Series 7: tof_fl3d_tra_iso · axial · 0.6mm · 0.52mm/px · z∈[-193,-114]mm · 11 of 133 slices shown]
[im 1/133]
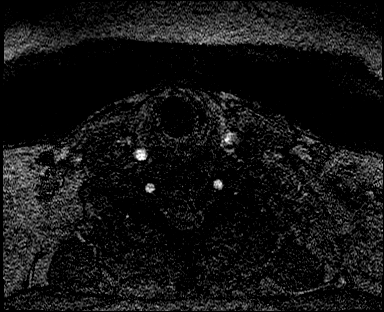
[im 12/133]
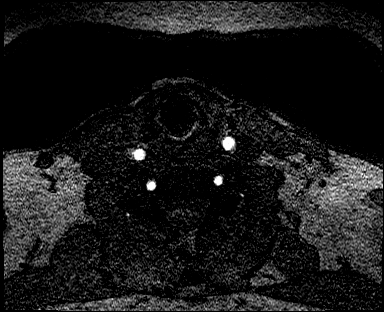
[im 23/133]
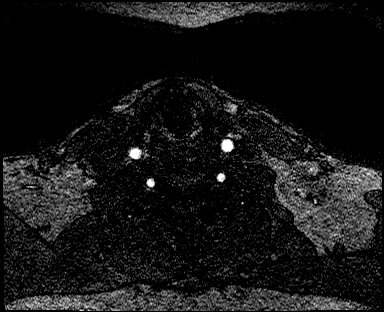
[im 34/133]
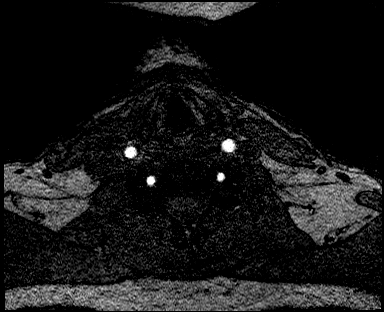
[im 45/133]
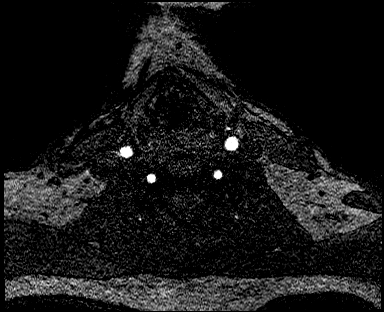
[im 56/133]
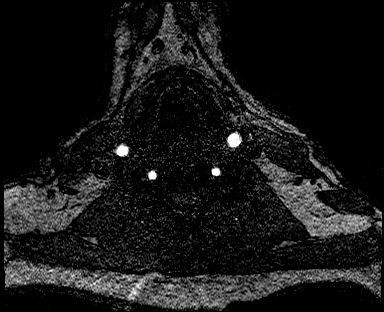
[im 67/133]
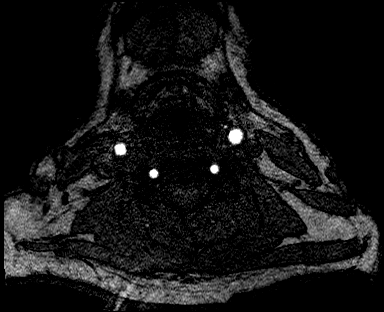
[im 78/133]
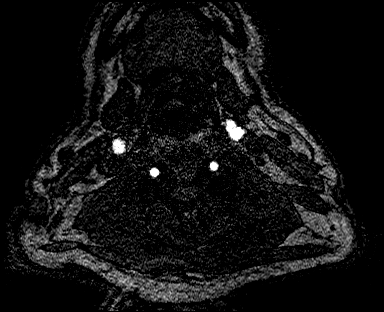
[im 89/133]
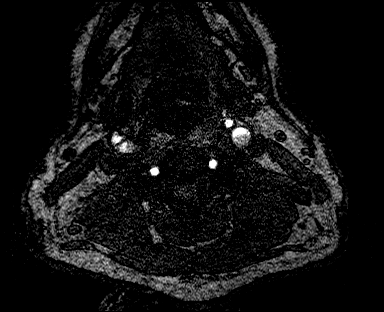
[im 111/133]
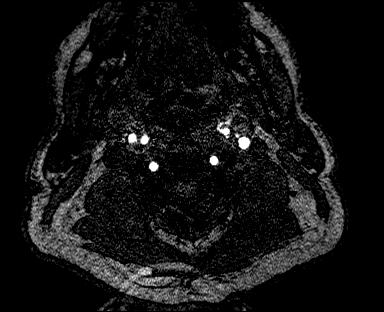
[im 133/133]
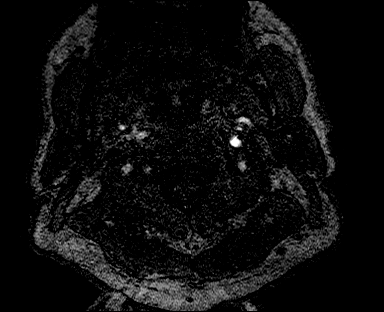

[Series 10: angio_fl3d_cor_pre_ttc=3.0s · coronal · 0.9mm · 0.85mm/px · 8 of 80 slices shown]
[im 1/80]
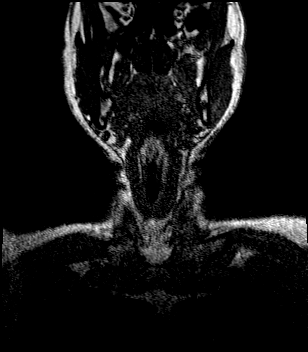
[im 12/80]
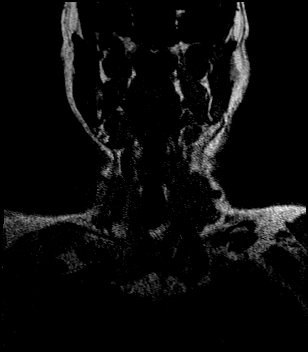
[im 23/80]
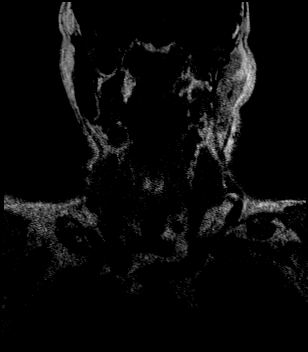
[im 34/80]
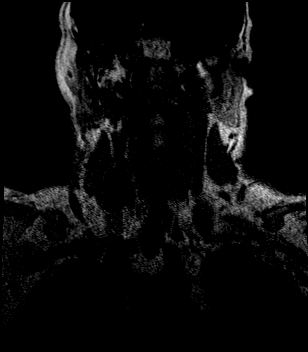
[im 46/80]
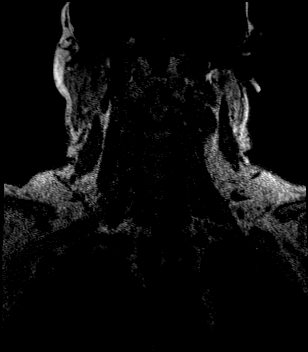
[im 57/80]
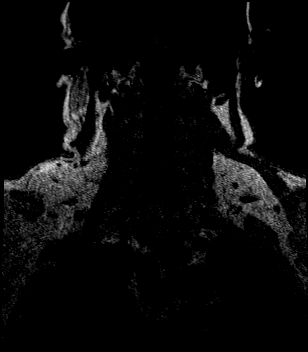
[im 68/80]
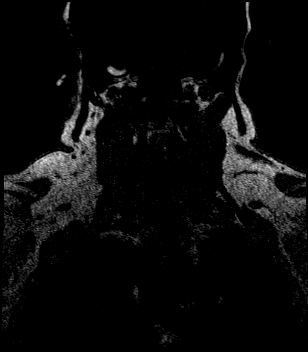
[im 80/80]
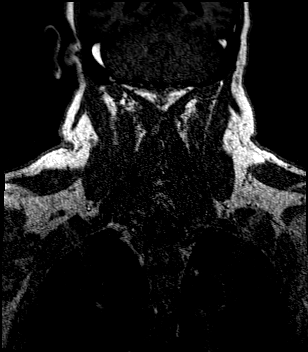

[Series 12: angio_fl3d_cor_post_ttc=3.0s · coronal · 0.9mm · 0.85mm/px · 7 of 77 slices shown]
[im 1/77]
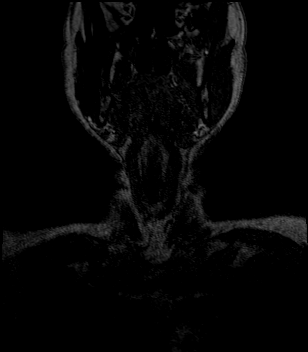
[im 13/77]
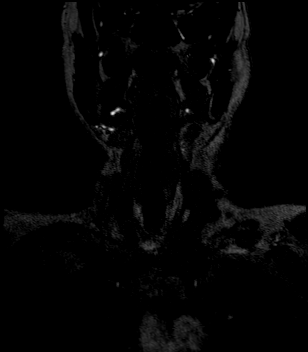
[im 26/77]
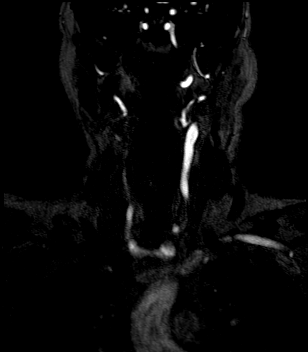
[im 39/77]
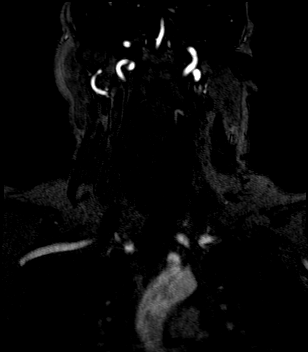
[im 51/77]
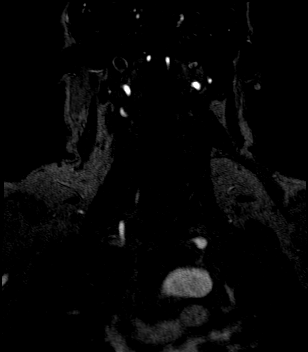
[im 64/77]
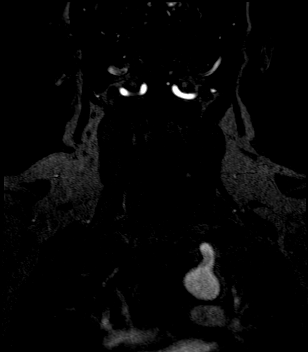
[im 77/77]
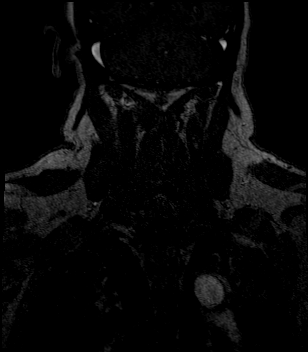

[Series 13: angio_fl3d_cor_post_ttc=3.0s_moco-adv · coronal · 0.9mm · 0.85mm/px · 7 of 77 slices shown]
[im 1/77]
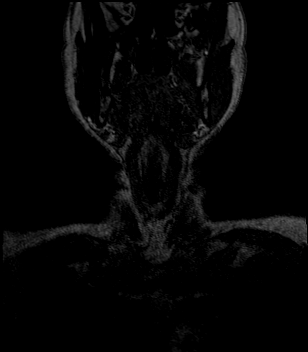
[im 13/77]
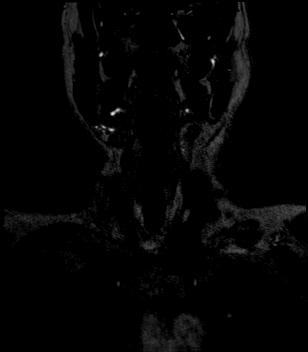
[im 26/77]
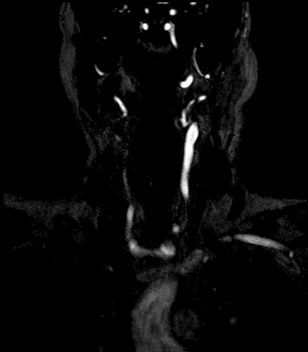
[im 39/77]
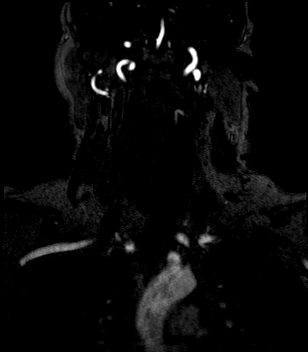
[im 51/77]
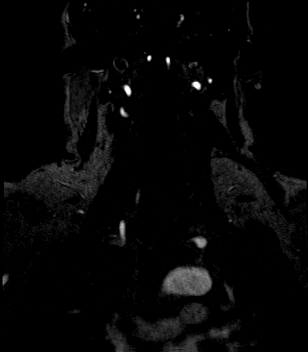
[im 64/77]
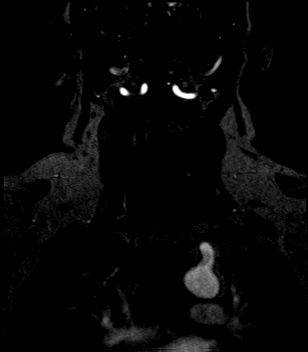
[im 77/77]
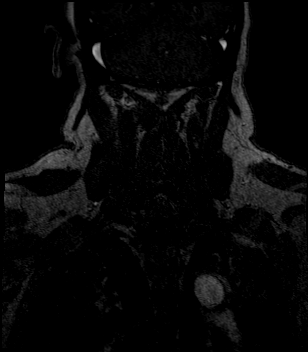

[Series 14: angio_fl3d_cor_post_ttc=3.0s_moco-adv_sub · coronal · 0.9mm · 0.85mm/px · 8 of 78 slices shown]
[im 1/78]
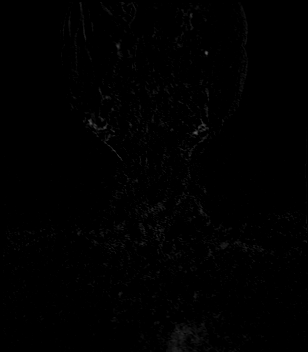
[im 12/78]
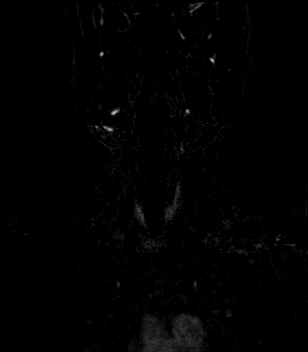
[im 23/78]
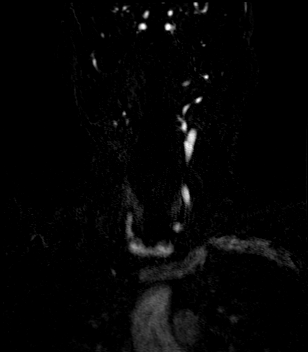
[im 34/78]
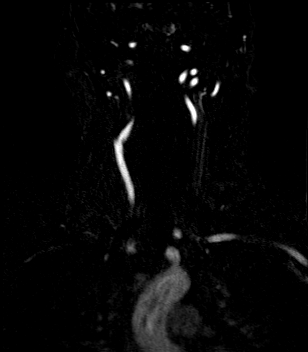
[im 45/78]
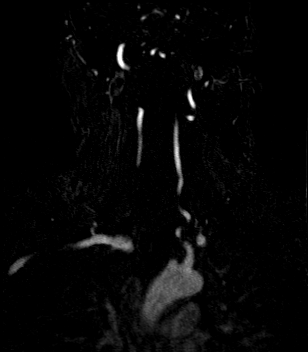
[im 56/78]
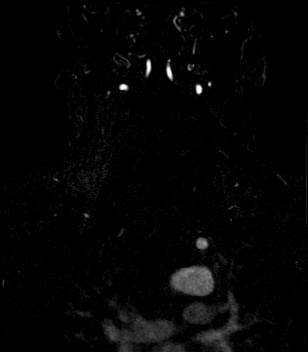
[im 67/78]
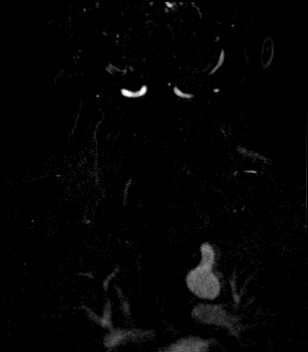
[im 78/78]
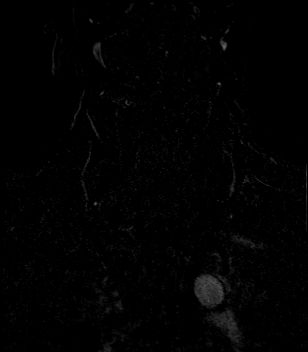

[41 of 48 positions shown; findings below may reference images not displayed]

FINDINGS: MR HEAD FINDINGS

BRAIN: There is no acute infarct, acute hemorrhage or extra-axial
collection. There is a focal area of hyperintense T2-weighted signal
in the right frontal lobe that is probably a tract from a prior
shunt catheter or biopsy. There is a small focus of encephalomalacia
in the dorsal left thalamus. Parenchymal signal is otherwise normal.
Mild generalized volume loss. The midline structures are normal.

VASCULAR: The major intracranial arterial and venous sinus flow
voids are normal. There is hemosiderin deposition at the left
thalamus, at the site of previously demonstrated arteriovenous
malformation.

SKULL AND UPPER CERVICAL SPINE: Calvarial bone marrow signal is
normal. There is no skull base mass. The visualized upper cervical
spine and soft tissues are normal.

SINUSES/ORBITS: There are no fluid levels or advanced mucosal
thickening. The mastoid air cells and middle ear cavities are free
of fluid. The orbits are normal.

MR CIRCLE OF WILLIS FINDINGS

POSTERIOR CIRCULATION:

--Vertebral arteries: Normal V4 segments.

--Posterior inferior cerebellar arteries (PICA): The right PICA and
AICA share a common origin. Normal origin of the left PICA from the
ipsilateral vertebral artery.

--Anterior inferior cerebellar arteries (AICA): Normal right. Poor
visualization of the left.

--Basilar artery: Normal.

--Superior cerebellar arteries: Normal.

--Posterior cerebral arteries: There is a small arteriovenous
malformation at the dorsal left thalamus with arterial supply from
the left PCA. The right PCA is normal. Drainage of the AVM is into
the vein of DABA.

ANTERIOR CIRCULATION:

--Intracranial internal carotid arteries: Normal.

--Anterior cerebral arteries (ACA): Normal. Both A1 segments are
present. Patent anterior communicating artery (a-comm).

--Middle cerebral arteries (MCA): Normal.

MRA NECK FINDINGS

Time-of-flight imaging shows normal antegrade signal within the
carotid and vertebral arteries. There is a conventional 3 vessel
branching pattern of the aortic arch. The proximal subclavian
arteries are normal. Both carotid and vertebral artery systems are
normal.
IMPRESSION: 1. No acute intracranial abnormality.
2. Small arteriovenous malformation at the dorsal left thalamus with
arterial supply from the left PCA.
3. Small focus of hyperintense T2-weighted signal in the right
frontal lobe, consistent with prior shunt catheter or biopsy.
4. Normal MRA of the neck.

## 2019-03-24 IMAGING — MR MR MRA HEAD W/O CM
1 series · 23 of 48 positions shown · IV contrast (gadavist)
Comparison: MRA head [DATE] and head CT [DATE]

CLINICAL DATA: Dizziness

EXAM:
MR HEAD WITHOUT CONTRAST
MR CIRCLE OF WILLIS WITHOUT CONTRAST
MRA OF THE NECK WITHOUT AND WITH CONTRAST
TECHNIQUE: Multiplanar, multiecho pulse sequences of the brain, circle of
willis and surrounding structures were obtained without intravenous
contrast. Angiographic images of the neck were obtained using MRA
technique without and with intravenous contrast.
CONTRAST:  8mL GADAVIST GADOBUTROL 1 MMOL/ML IV SOLN

[Series 1: 3d cow · axial · 0.5mm · 0.41mm/px · z∈[-96,-7]mm · 23 of 188 slices shown]
[im 1/188]
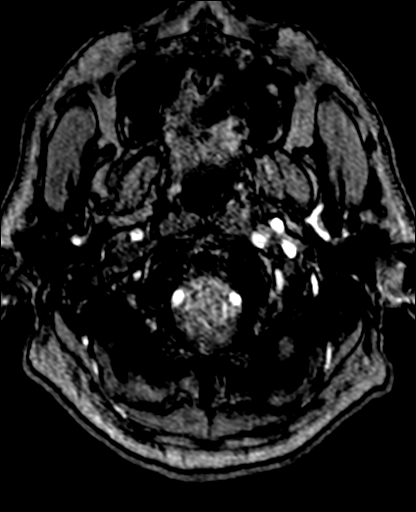
[im 4/188]
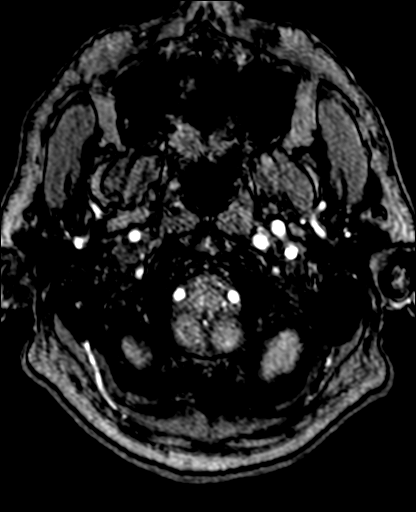
[im 8/188]
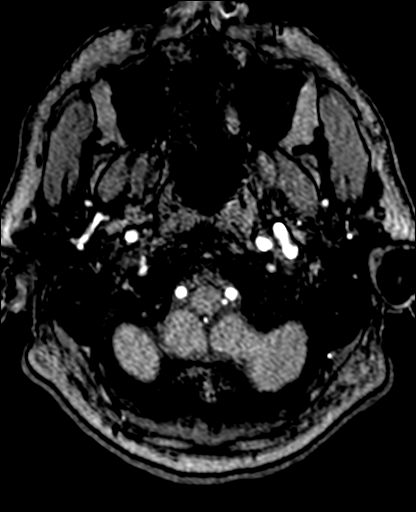
[im 12/188]
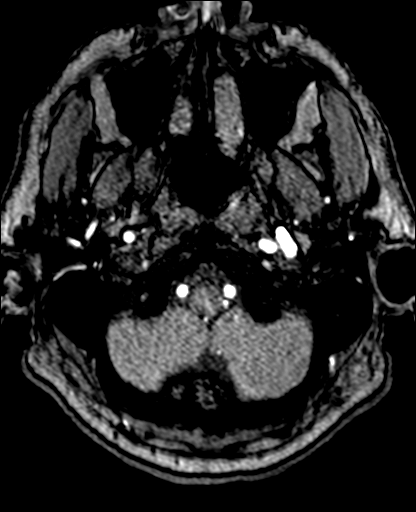
[im 16/188]
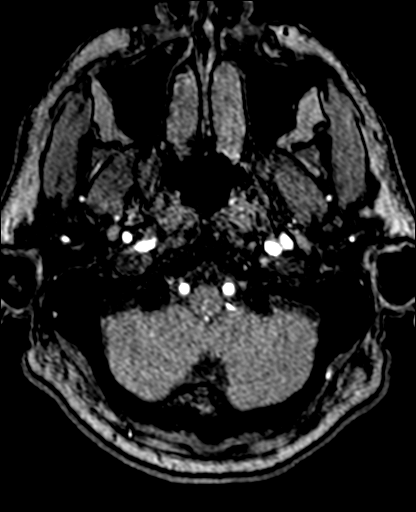
[im 20/188]
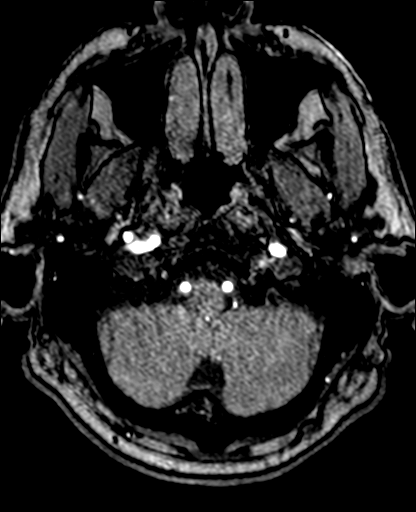
[im 24/188]
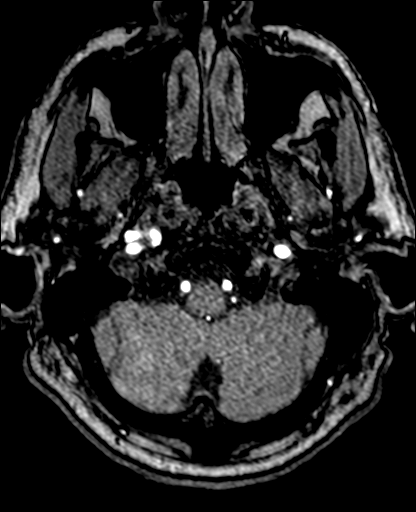
[im 28/188]
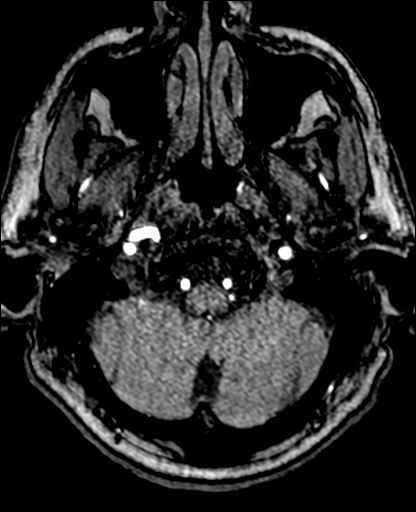
[im 32/188]
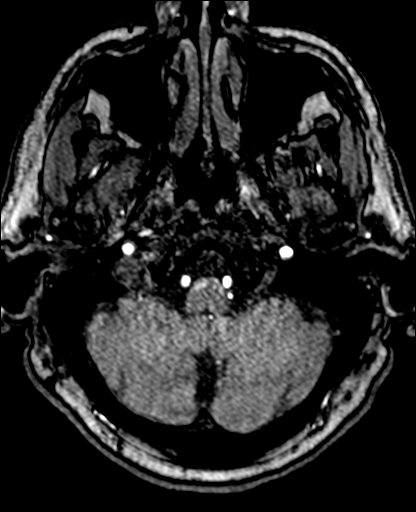
[im 36/188]
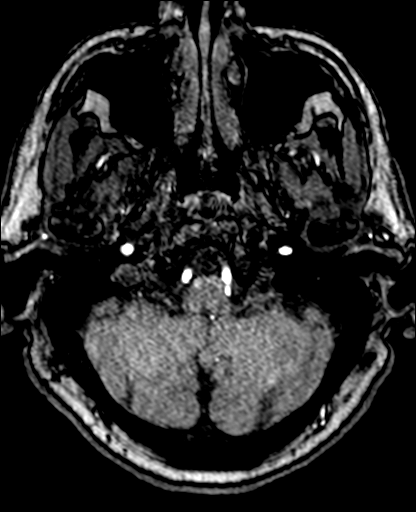
[im 40/188]
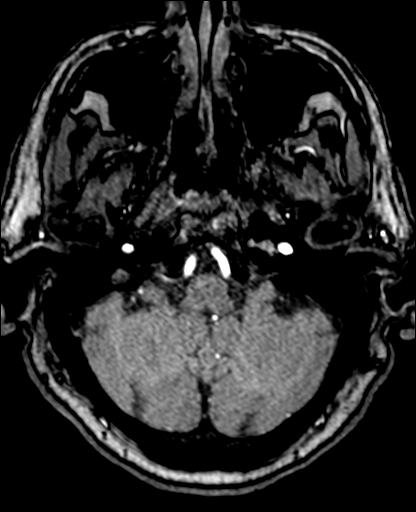
[im 44/188]
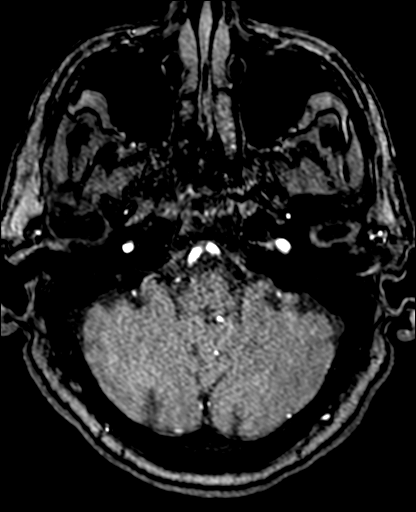
[im 48/188]
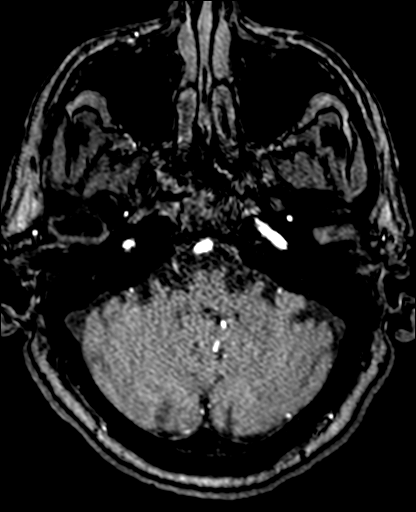
[im 52/188]
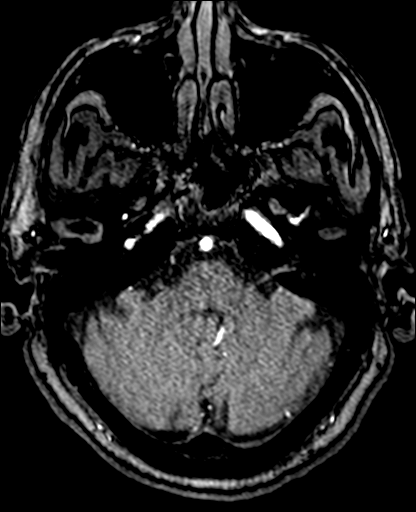
[im 56/188]
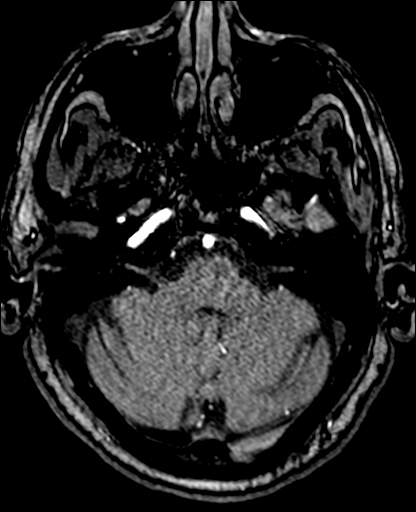
[im 60/188]
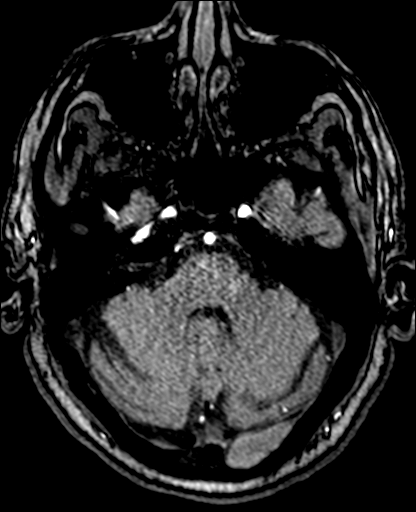
[im 84/188]
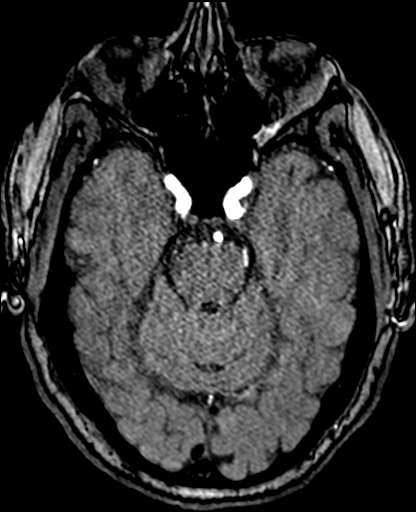
[im 96/188]
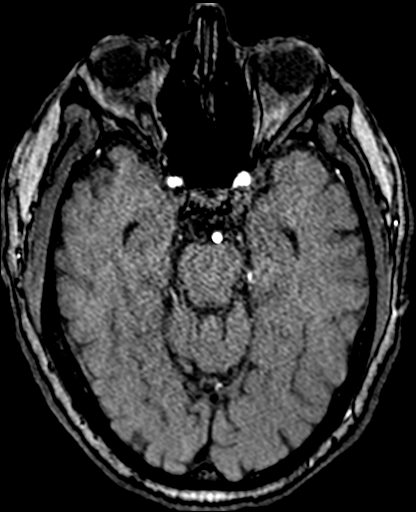
[im 108/188]
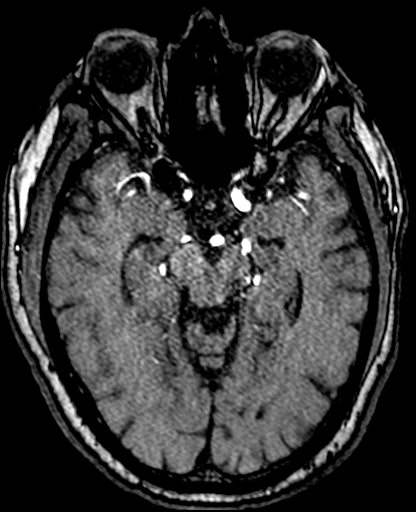
[im 132/188]
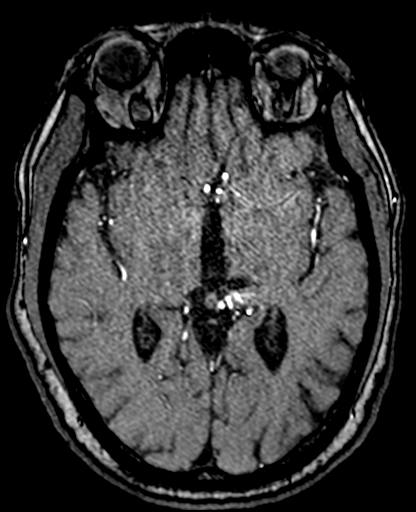
[im 156/188]
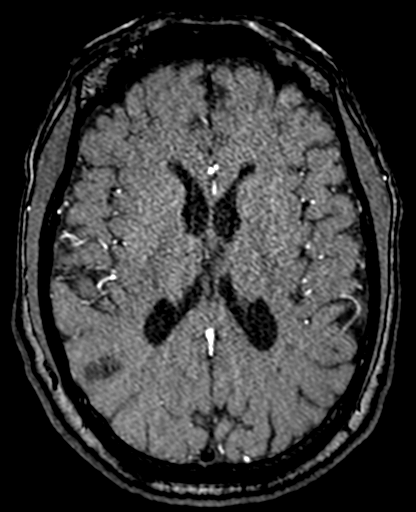
[im 160/188]
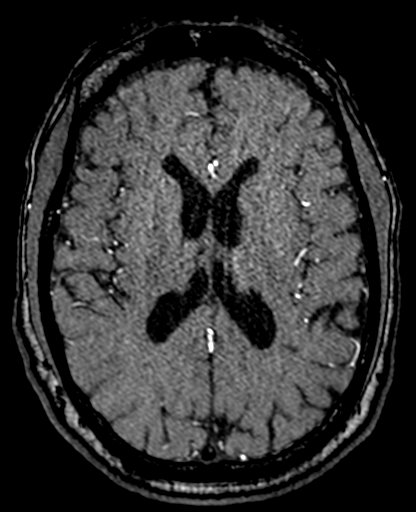
[im 180/188]
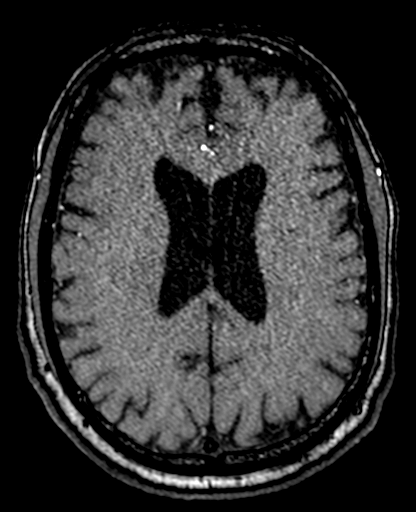

[23 of 48 positions shown; findings below may reference images not displayed]

FINDINGS: MR HEAD FINDINGS

BRAIN: There is no acute infarct, acute hemorrhage or extra-axial
collection. There is a focal area of hyperintense T2-weighted signal
in the right frontal lobe that is probably a tract from a prior
shunt catheter or biopsy. There is a small focus of encephalomalacia
in the dorsal left thalamus. Parenchymal signal is otherwise normal.
Mild generalized volume loss. The midline structures are normal.

VASCULAR: The major intracranial arterial and venous sinus flow
voids are normal. There is hemosiderin deposition at the left
thalamus, at the site of previously demonstrated arteriovenous
malformation.

SKULL AND UPPER CERVICAL SPINE: Calvarial bone marrow signal is
normal. There is no skull base mass. The visualized upper cervical
spine and soft tissues are normal.

SINUSES/ORBITS: There are no fluid levels or advanced mucosal
thickening. The mastoid air cells and middle ear cavities are free
of fluid. The orbits are normal.

MR CIRCLE OF WILLIS FINDINGS

POSTERIOR CIRCULATION:

--Vertebral arteries: Normal V4 segments.

--Posterior inferior cerebellar arteries (PICA): The right PICA and
AICA share a common origin. Normal origin of the left PICA from the
ipsilateral vertebral artery.

--Anterior inferior cerebellar arteries (AICA): Normal right. Poor
visualization of the left.

--Basilar artery: Normal.

--Superior cerebellar arteries: Normal.

--Posterior cerebral arteries: There is a small arteriovenous
malformation at the dorsal left thalamus with arterial supply from
the left PCA. The right PCA is normal. Drainage of the AVM is into
the vein of DABA.

ANTERIOR CIRCULATION:

--Intracranial internal carotid arteries: Normal.

--Anterior cerebral arteries (ACA): Normal. Both A1 segments are
present. Patent anterior communicating artery (a-comm).

--Middle cerebral arteries (MCA): Normal.

MRA NECK FINDINGS

Time-of-flight imaging shows normal antegrade signal within the
carotid and vertebral arteries. There is a conventional 3 vessel
branching pattern of the aortic arch. The proximal subclavian
arteries are normal. Both carotid and vertebral artery systems are
normal.
IMPRESSION: 1. No acute intracranial abnormality.
2. Small arteriovenous malformation at the dorsal left thalamus with
arterial supply from the left PCA.
3. Small focus of hyperintense T2-weighted signal in the right
frontal lobe, consistent with prior shunt catheter or biopsy.
4. Normal MRA of the neck.

## 2019-03-24 IMAGING — MR MR HEAD W/O CM
10 of 11 series · 43 of 48 positions shown · IV contrast (gadavist)
Comparison: MRA head [DATE] and head CT [DATE]

CLINICAL DATA: Dizziness

EXAM:
MR HEAD WITHOUT CONTRAST
MR CIRCLE OF WILLIS WITHOUT CONTRAST
MRA OF THE NECK WITHOUT AND WITH CONTRAST
TECHNIQUE: Multiplanar, multiecho pulse sequences of the brain, circle of
willis and surrounding structures were obtained without intravenous
contrast. Angiographic images of the neck were obtained using MRA
technique without and with intravenous contrast.
CONTRAST:  8mL GADAVIST GADOBUTROL 1 MMOL/ML IV SOLN

[Series 5: DWI · axial · 3.0mm · 0.88mm/px · z∈[-70,+69]mm · 10 of 96 slices shown (1 of 4)]
[im 1/96]
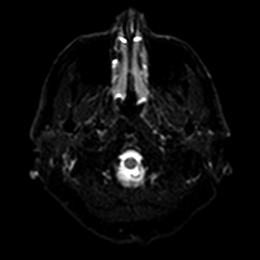
[im 11/96]
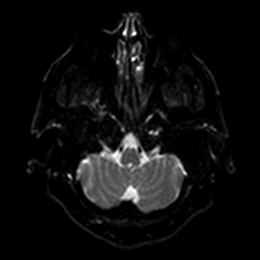
[im 22/96]
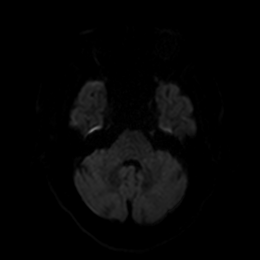
[im 32/96]
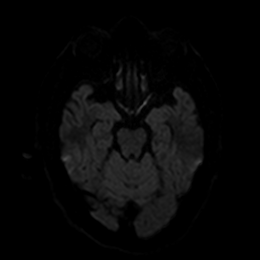
[im 43/96]
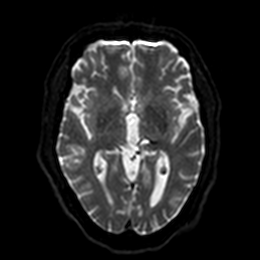
[im 53/96]
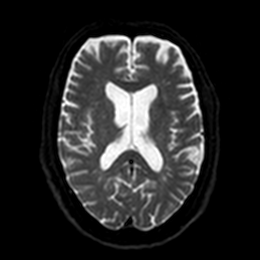
[im 64/96]
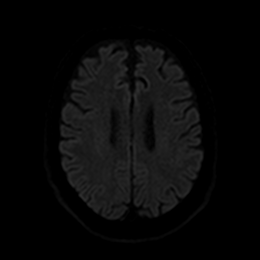
[im 74/96]
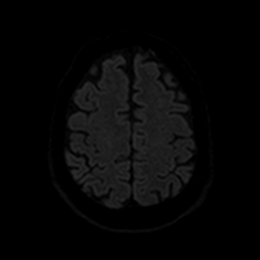
[im 85/96]
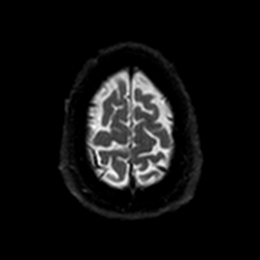
[im 96/96]
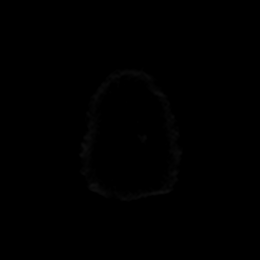

[Series 6: DWI · axial · 3.0mm · 0.88mm/px · z∈[-70,+69]mm · 5 of 48 slices shown (2 of 4)]
[im 1/48]
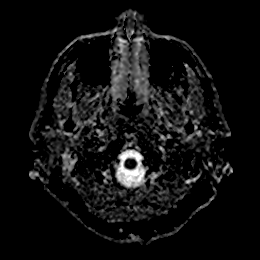
[im 12/48]
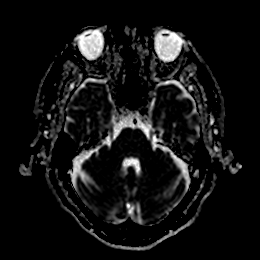
[im 24/48]
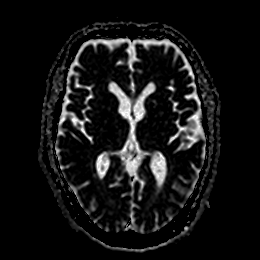
[im 36/48]
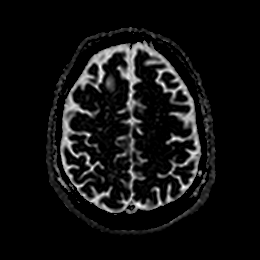
[im 48/48]
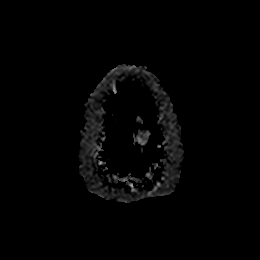

[Series 7: DWI · coronal · 4.0mm · 0.88mm/px · 6 of 72 slices shown (3 of 4)]
[im 1/72]
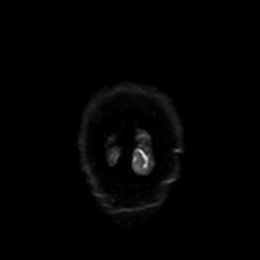
[im 15/72]
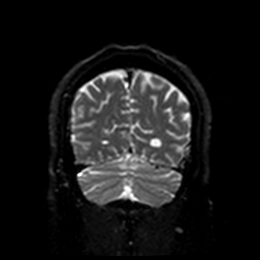
[im 29/72]
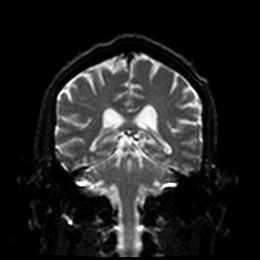
[im 43/72]
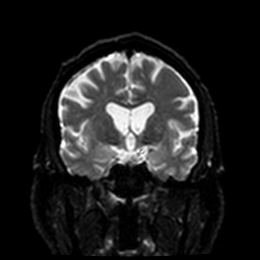
[im 57/72]
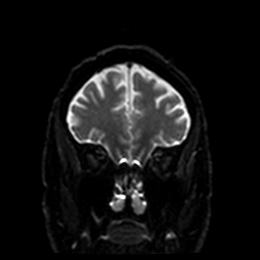
[im 72/72]
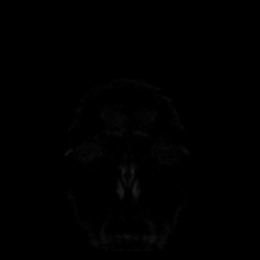

[Series 8: DWI · coronal · 4.0mm · 0.88mm/px · 3 of 36 slices shown (4 of 4)]
[im 1/36]
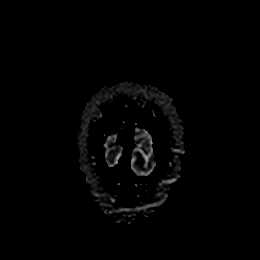
[im 18/36]
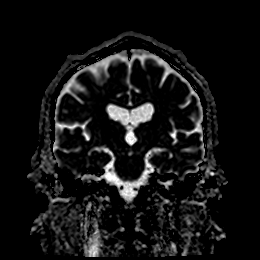
[im 36/36]
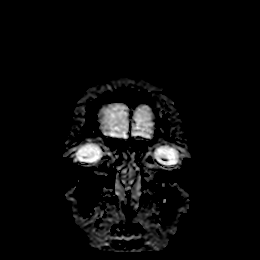

[Series 9: T1 · sagittal · 5.0mm · 0.75mm/px · 2 of 25 slices shown]
[im 1/25]
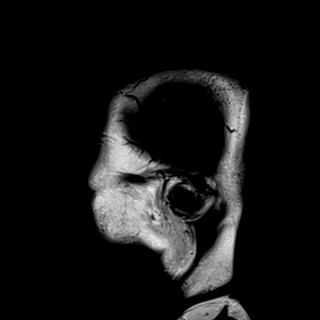
[im 25/25]
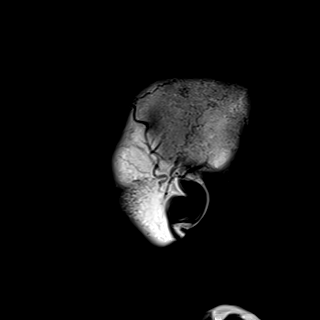

[Series 10: T2 · axial · 5.0mm · 0.72mm/px · z∈[-85,+64]mm · 2 of 26 slices shown (1 of 2)]
[im 1/26]
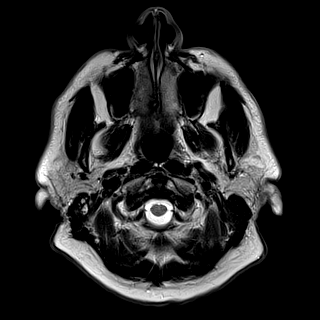
[im 26/26]
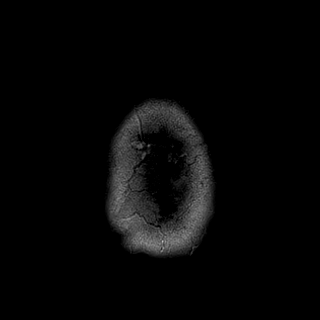

[Series 11: FLAIR · axial · 5.0mm · 0.45mm/px · z∈[-85,+64]mm · 2 of 26 slices shown]
[im 1/26]
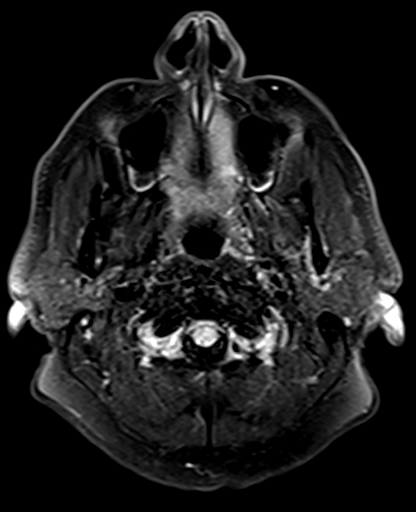
[im 26/26]
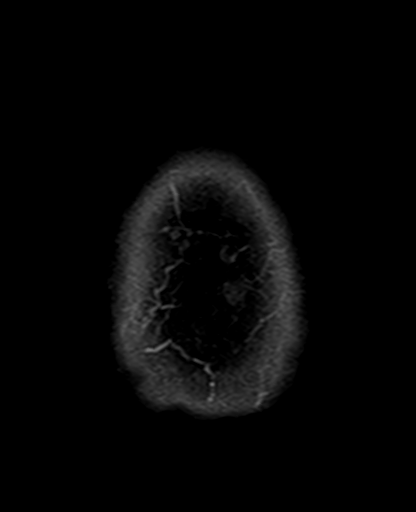

[Series 13: pha_images · axial · 3.0mm · 0.90mm/px · z∈[-98,+72]mm · 5 of 57 slices shown]
[im 1/57]
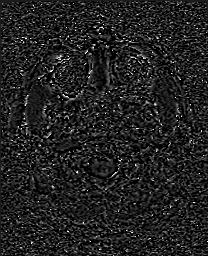
[im 15/57]
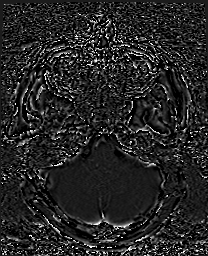
[im 29/57]
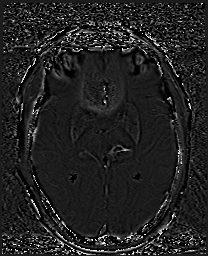
[im 43/57]
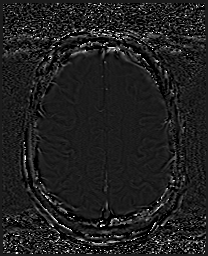
[im 57/57]
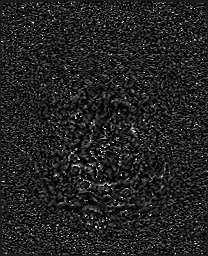

[Series 14: swi_images · axial · 3.0mm · 0.90mm/px · z∈[-98,+78]mm · 5 of 60 slices shown]
[im 1/60]
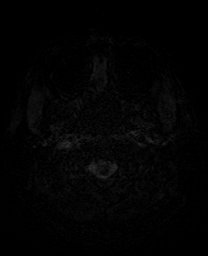
[im 15/60]
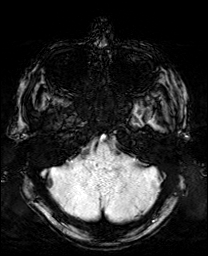
[im 30/60]
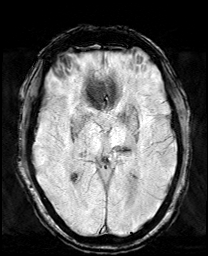
[im 45/60]
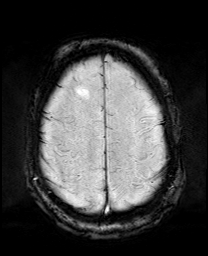
[im 60/60]
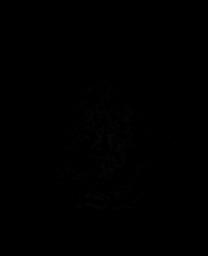

[Series 17: T2 · coronal · 5.0mm · 0.34mm/px · 3 of 30 slices shown (2 of 2)]
[im 1/30]
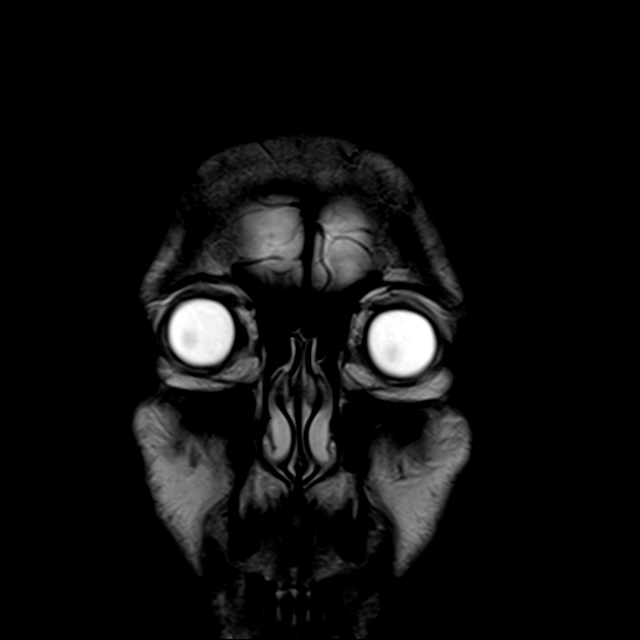
[im 15/30]
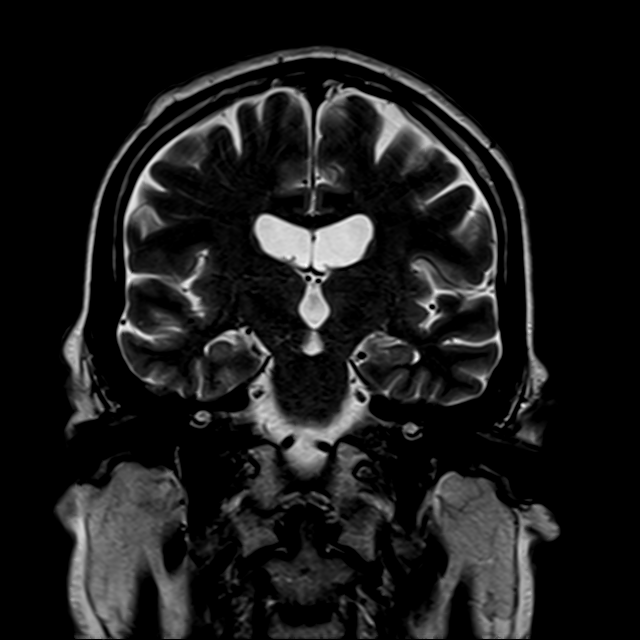
[im 30/30]
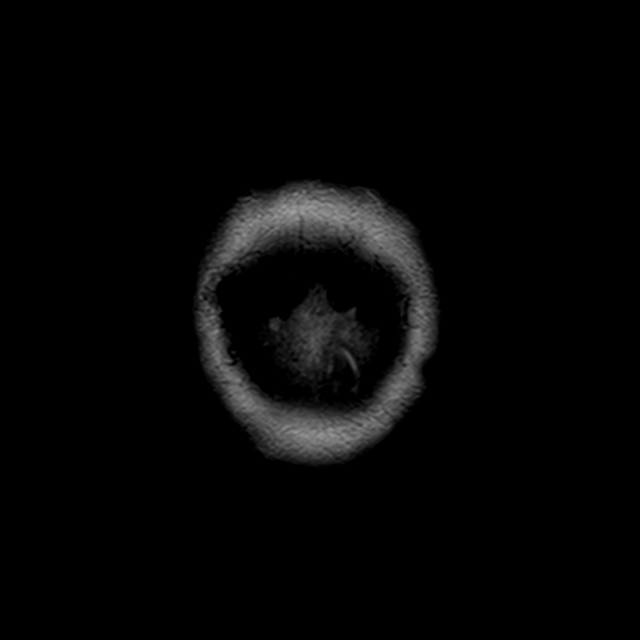

[43 of 48 positions shown; findings below may reference images not displayed]

FINDINGS: MR HEAD FINDINGS

BRAIN: There is no acute infarct, acute hemorrhage or extra-axial
collection. There is a focal area of hyperintense T2-weighted signal
in the right frontal lobe that is probably a tract from a prior
shunt catheter or biopsy. There is a small focus of encephalomalacia
in the dorsal left thalamus. Parenchymal signal is otherwise normal.
Mild generalized volume loss. The midline structures are normal.

VASCULAR: The major intracranial arterial and venous sinus flow
voids are normal. There is hemosiderin deposition at the left
thalamus, at the site of previously demonstrated arteriovenous
malformation.

SKULL AND UPPER CERVICAL SPINE: Calvarial bone marrow signal is
normal. There is no skull base mass. The visualized upper cervical
spine and soft tissues are normal.

SINUSES/ORBITS: There are no fluid levels or advanced mucosal
thickening. The mastoid air cells and middle ear cavities are free
of fluid. The orbits are normal.

MR CIRCLE OF WILLIS FINDINGS

POSTERIOR CIRCULATION:

--Vertebral arteries: Normal V4 segments.

--Posterior inferior cerebellar arteries (PICA): The right PICA and
AICA share a common origin. Normal origin of the left PICA from the
ipsilateral vertebral artery.

--Anterior inferior cerebellar arteries (AICA): Normal right. Poor
visualization of the left.

--Basilar artery: Normal.

--Superior cerebellar arteries: Normal.

--Posterior cerebral arteries: There is a small arteriovenous
malformation at the dorsal left thalamus with arterial supply from
the left PCA. The right PCA is normal. Drainage of the AVM is into
the vein of DABA.

ANTERIOR CIRCULATION:

--Intracranial internal carotid arteries: Normal.

--Anterior cerebral arteries (ACA): Normal. Both A1 segments are
present. Patent anterior communicating artery (a-comm).

--Middle cerebral arteries (MCA): Normal.

MRA NECK FINDINGS

Time-of-flight imaging shows normal antegrade signal within the
carotid and vertebral arteries. There is a conventional 3 vessel
branching pattern of the aortic arch. The proximal subclavian
arteries are normal. Both carotid and vertebral artery systems are
normal.
IMPRESSION: 1. No acute intracranial abnormality.
2. Small arteriovenous malformation at the dorsal left thalamus with
arterial supply from the left PCA.
3. Small focus of hyperintense T2-weighted signal in the right
frontal lobe, consistent with prior shunt catheter or biopsy.
4. Normal MRA of the neck.

## 2019-03-24 MED ORDER — ACETAMINOPHEN 325 MG PO TABS
650.0000 mg | ORAL_TABLET | Freq: Once | ORAL | Status: AC
  Administered 2019-03-24: 650 mg via ORAL
  Filled 2019-03-24: qty 2

## 2019-03-24 MED ORDER — GADOBUTROL 1 MMOL/ML IV SOLN
8.0000 mL | Freq: Once | INTRAVENOUS | Status: AC | PRN
  Administered 2019-03-24: 8 mL via INTRAVENOUS

## 2019-03-24 NOTE — ED Notes (Signed)
Pt verbalized understanding of d/c instructions and follow up care. No additional questions at this time.

## 2019-03-24 NOTE — ED Notes (Signed)
Pt transported to MRI 

## 2020-08-04 DIAGNOSIS — G309 Alzheimer's disease, unspecified: Secondary | ICD-10-CM

## 2020-08-04 HISTORY — DX: Alzheimer's disease, unspecified: G30.9

## 2023-10-07 ENCOUNTER — Emergency Department (HOSPITAL_COMMUNITY)

## 2023-10-07 ENCOUNTER — Emergency Department (HOSPITAL_COMMUNITY)
Admission: EM | Admit: 2023-10-07 | Discharge: 2023-10-07 | Disposition: A | Discharge status: Home / Self Care | Attending: Emergency Medicine | Admitting: Emergency Medicine

## 2023-10-07 ENCOUNTER — Other Ambulatory Visit: Payer: Self-pay

## 2023-10-07 ENCOUNTER — Encounter (HOSPITAL_COMMUNITY): Payer: Self-pay

## 2023-10-07 DIAGNOSIS — I10 Essential (primary) hypertension: Secondary | ICD-10-CM | POA: Insufficient documentation

## 2023-10-07 DIAGNOSIS — R531 Weakness: Secondary | ICD-10-CM | POA: Diagnosis not present

## 2023-10-07 DIAGNOSIS — R42 Dizziness and giddiness: Secondary | ICD-10-CM | POA: Insufficient documentation

## 2023-10-07 DIAGNOSIS — G309 Alzheimer's disease, unspecified: Secondary | ICD-10-CM | POA: Diagnosis not present

## 2023-10-07 DIAGNOSIS — R202 Paresthesia of skin: Secondary | ICD-10-CM | POA: Insufficient documentation

## 2023-10-07 DIAGNOSIS — Z79899 Other long term (current) drug therapy: Secondary | ICD-10-CM | POA: Diagnosis not present

## 2023-10-07 DIAGNOSIS — F02B0 Dementia in other diseases classified elsewhere, moderate, without behavioral disturbance, psychotic disturbance, mood disturbance, and anxiety: Secondary | ICD-10-CM

## 2023-10-07 LAB — CBC WITH DIFFERENTIAL/PLATELET
Abs Immature Granulocytes: 0.03 10*3/uL (ref 0.00–0.07)
Basophils Absolute: 0.1 10*3/uL (ref 0.0–0.1)
Basophils Relative: 1 %
Eosinophils Absolute: 0.1 10*3/uL (ref 0.0–0.5)
Eosinophils Relative: 1 %
HCT: 42.1 % (ref 39.0–52.0)
Hemoglobin: 13.9 g/dL (ref 13.0–17.0)
Immature Granulocytes: 0 %
Lymphocytes Relative: 13 %
Lymphs Abs: 1 10*3/uL (ref 0.7–4.0)
MCH: 31.1 pg (ref 26.0–34.0)
MCHC: 33 g/dL (ref 30.0–36.0)
MCV: 94.2 fL (ref 80.0–100.0)
Monocytes Absolute: 0.6 10*3/uL (ref 0.1–1.0)
Monocytes Relative: 8 %
Neutro Abs: 6.1 10*3/uL (ref 1.7–7.7)
Neutrophils Relative %: 77 %
Platelets: 153 10*3/uL (ref 150–400)
RBC: 4.47 MIL/uL (ref 4.22–5.81)
RDW: 13.6 % (ref 11.5–15.5)
WBC: 7.8 10*3/uL (ref 4.0–10.5)
nRBC: 0 % (ref 0.0–0.2)

## 2023-10-07 LAB — COMPREHENSIVE METABOLIC PANEL WITH GFR
ALT: 16 U/L (ref 0–44)
AST: 18 U/L (ref 15–41)
Albumin: 4.1 g/dL (ref 3.5–5.0)
Alkaline Phosphatase: 61 U/L (ref 38–126)
Anion gap: 8 (ref 5–15)
BUN: 9 mg/dL (ref 8–23)
CO2: 25 mmol/L (ref 22–32)
Calcium: 9.8 mg/dL (ref 8.9–10.3)
Chloride: 106 mmol/L (ref 98–111)
Creatinine, Ser: 1.26 mg/dL — ABNORMAL HIGH (ref 0.61–1.24)
GFR, Estimated: >60 mL/min (ref >60)
Glucose, Bld: 104 mg/dL — ABNORMAL HIGH (ref 70–99)
Potassium: 3.7 mmol/L (ref 3.5–5.1)
Sodium: 139 mmol/L (ref 135–145)
Total Bilirubin: 0.7 mg/dL (ref 0.0–1.2)
Total Protein: 6.4 g/dL — ABNORMAL LOW (ref 6.5–8.1)

## 2023-10-07 LAB — URINALYSIS, ROUTINE W REFLEX MICROSCOPIC
Bilirubin Urine: NEGATIVE
Glucose, UA: NEGATIVE mg/dL
Hgb urine dipstick: NEGATIVE
Ketones, ur: NEGATIVE mg/dL
Leukocytes,Ua: NEGATIVE
Nitrite: NEGATIVE
Protein, ur: NEGATIVE mg/dL
Specific Gravity, Urine: 1.012 (ref 1.005–1.030)
pH: 6 (ref 5.0–8.0)

## 2023-10-07 LAB — CBG MONITORING, ED: Glucose-Capillary: 102 mg/dL — ABNORMAL HIGH (ref 70–99)

## 2023-10-07 LAB — TROPONIN I (HIGH SENSITIVITY)
Troponin I (High Sensitivity): 3 ng/L (ref <18)
Troponin I (High Sensitivity): 4 ng/L (ref <18)

## 2023-10-07 MED ORDER — LORAZEPAM 1 MG PO TABS
1.0000 mg | ORAL_TABLET | Freq: Once | ORAL | Status: AC
  Administered 2023-10-07: 1 mg via ORAL
  Filled 2023-10-07: qty 1

## 2023-10-07 NOTE — ED Provider Notes (Signed)
 [place] [facility] AT [facility] Provider Note   CSN: [id number] Arrival date & time: 10/07/23  6440     History  Chief Complaint  Patient presents with   Weakness    Blake Sullivan is a 68 y.o. male with past medical history of CVA, HTN, Alzheimer's presents to emergency department via EMS for evaluation of numbness.  Patient son at bedside reports that patient has been having "dizzy spells" over the past week.  Today, the patient stated "I feel numb all over".  He also had associated dizziness which lasted about 30 minutes causing him to be unable to stand up. Lives at home with son but son reports "I think he may be taking his medication incorrectly". Patient currently has no complaints. Denies recent falls, NVD, fevers, nor thinners.   Weakness      Home Medications Prior to Admission medications   Medication Sig Start Date End Date Taking? Authorizing Provider  amLODipine (NORVASC) 10 MG tablet Take 10 mg by mouth daily. 02/23/19   [provider]  atomoxetine (STRATTERA) 40 MG capsule Take 40 mg by mouth daily. 03/15/19   [provider]  atorvastatin (LIPITOR) 80 MG tablet Take 80 mg by mouth daily at 6 PM.  06/14/17   [provider]  brimonidine (ALPHAGAN) 0.2 % ophthalmic solution Place 1 drop into both eyes 2 (two) times daily. 05/29/18   [provider]  buPROPion (WELLBUTRIN XL) 300 MG 24 hr tablet Take 300 mg by mouth daily. 02/09/18   [provider]  divalproex (DEPAKOTE ER) 500 MG 24 hr tablet Take 1,000 mg by mouth daily. 05/25/18   [provider]  dorzolamide-timolol (COSOPT) 22.3-6.8 MG/ML ophthalmic solution Place 1 drop into both eyes 2 (two) times daily. 05/25/18   [provider]  gabapentin (NEURONTIN) 400 MG capsule Take 400 mg by mouth 2 (two) times daily. 06/03/17   [provider]  latanoprost (XALATAN) 0.005 % ophthalmic solution Place 1 drop into both eyes at  bedtime. 06/24/17   [provider]  omeprazole (PRILOSEC) 20 MG capsule Take 20 mg by mouth daily. 02/23/19   [provider]  ramipril (ALTACE) 10 MG capsule Take 10 mg by mouth daily. 06/10/17   [provider]      Allergies    Patient has no known allergies.    Review of Systems   Review of Systems  Neurological:  Positive for weakness.    Physical Exam Updated Vital Signs BP 120/79   Pulse 90   Temp 98.2 F (36.8 C)   Resp 18   Ht 5\' 9"  (1.753 m)   Wt 82.6 kg   SpO2 100%   BMI 26.88 kg/m  Physical Exam Vitals and nursing note reviewed.  Constitutional:      General: He is not in acute distress.    Appearance: Normal appearance. He is not diaphoretic.  HENT:     Head: Normocephalic and atraumatic. No raccoon eyes or Battle's sign.     Comments: No hematoma nor TTP of cranium No crepitus to facial bones    Right Ear: External ear normal. No hemotympanum.     Left Ear: External ear normal. No hemotympanum.     Nose: Nose normal.     Right Nostril: No epistaxis or septal hematoma.     Left Nostril: No epistaxis or septal hematoma.     Mouth/Throat:     Mouth: Mucous membranes are moist. No injury or lacerations.  Eyes:     General: Lids are normal. Vision grossly intact. No visual field deficit.       Right eye: No discharge.        Left eye: No discharge.     Extraocular Movements: Extraocular movements intact.     Right eye: Normal extraocular motion and no nystagmus.     Left eye: Normal extraocular motion and no nystagmus.     Conjunctiva/sclera: Conjunctivae normal.     Pupils: Pupils are equal, round, and reactive to light.     Comments: No subconjunctival hemorrhage, hyphema, tear drop pupil, or fluid leakage bilaterally  Neck:     Vascular: No carotid bruit.  Cardiovascular:     Rate and Rhythm: Normal rate.     Pulses: Normal pulses.          Radial pulses are 2+ on the right side and 2+ on the left side.       Dorsalis pedis  pulses are 2+ on the right side and 2+ on the left side.  Pulmonary:     Effort: Pulmonary effort is normal. No respiratory distress.     Breath sounds: Normal breath sounds. No wheezing.  Chest:     Chest wall: No tenderness.  Abdominal:     General: Bowel sounds are normal. There is no distension.     Palpations: Abdomen is soft.     Tenderness: There is no abdominal tenderness. There is no guarding or rebound.  Musculoskeletal:     Cervical back: Full passive range of motion without pain, normal range of motion and neck supple. No deformity, rigidity or bony tenderness. Normal range of motion.     Thoracic back: No deformity or bony tenderness. Normal range of motion.     Lumbar back: No deformity or bony tenderness. Normal range of motion.     Right hip: No bony tenderness or crepitus.     Left hip: No bony tenderness or crepitus.     Right lower leg: No edema.     Left lower leg: No edema.     Comments: No obvious deformity to joints or long bones Pelvis stable with no shortening or rotation of LE bilaterally  Skin:    General: Skin is warm and dry.     Capillary Refill: Capillary refill takes less than 2 seconds.     Coloration: Skin is not jaundiced or pale.  Neurological:     General: No focal deficit present.     Mental Status: He is alert and oriented to person, place, and time. Mental status is at baseline.     GCS: GCS eye subscore is 4. GCS verbal subscore is 5. GCS motor subscore is 6.     Cranial Nerves: Cranial nerves 2-12 are intact. No cranial nerve deficit, dysarthria or facial asymmetry.     Sensory: Sensation is intact. No sensory deficit.     Motor: Motor function is intact. No weakness, tremor, abnormal muscle tone, seizure activity or pronator drift.     Coordination: Coordination is intact. Coordination normal. Finger-Nose-Finger Test and Heel to [facility] Test normal.     Gait: Gait is intact. Gait normal.     Deep Tendon Reflexes: Reflexes are normal and  symmetric. Reflexes normal.     Comments: following commands appropriately  Psychiatric:        Speech: Speech is tangential (per baseline from dementia per son).     ED Results / Procedures / Treatments   Labs (all labs  ordered are listed, but only abnormal results are displayed) Labs Reviewed  COMPREHENSIVE METABOLIC PANEL WITH GFR - Abnormal; Notable for the following components:      Result Value   Glucose, Bld 104 (*)    Creatinine, Ser 1.26 (*)    Total Protein 6.4 (*)    All other components within normal limits  CBG MONITORING, ED - Abnormal; Notable for the following components:   Glucose-Capillary 102 (*)    All other components within normal limits  CBC WITH DIFFERENTIAL/PLATELET  URINALYSIS, ROUTINE W REFLEX MICROSCOPIC  TROPONIN I (HIGH SENSITIVITY)  TROPONIN I (HIGH SENSITIVITY)    EKG None  Radiology CT HEAD WO CONTRAST Result Date: 10/07/2023 CLINICAL DATA:  68 year old male with neurologic deficit. Weakness, previous stroke. Clinically progressing dementia. EXAM: CT HEAD WITHOUT CONTRAST TECHNIQUE: Contiguous axial images were obtained from the base of the skull through the vertex without intravenous contrast. RADIATION DOSE REDUCTION: This exam was performed according to the departmental dose-optimization program which includes automated exposure control, adjustment of the mA and/or kV according to patient size and/or use of iterative reconstruction technique. COMPARISON:  Head CT, brain MRI and MRA 03/24/2019. FINDINGS: Brain: Chronic lacunar infarct of the dorsal left thalamus appears stable since 2020 (series 2, image 17). Patchy chronic encephalomalacia in anterior right superior frontal gyrus also appears stable. Gray-white differentiation elsewhere appears stable and within normal limits. No other encephalomalacia identified. And cerebral volume seems stable and normal for age. No regional disproportionate brain atrophy is identified. No midline shift,  ventriculomegaly, mass effect, evidence of mass lesion, intracranial hemorrhage or evidence of cortically based acute infarction. Vascular: Calcified atherosclerosis at the skull base. No suspicious intracranial vascular hyperdensity. Skull: Intact, negative. Sinuses/Orbits: Tympanic cavities, Visualized paranasal sinuses and mastoids are clear. Other: Visualized orbits and scalp soft tissues are within normal limits. IMPRESSION: 1. No acute intracranial abnormality. 2. Patchy chronic encephalomalacia in the anterior right superior frontal gyrus, and chronic lacunar infarct of the left thalamus are stable since 2020. Electronically Signed   By: Marlise Simpers M.D.   On: 10/07/2023 06:30    Procedures Procedures    Medications Ordered in ED Medications - No data to display  ED Course/ Medical Decision Making/ A&P                                 Medical Decision Making Amount and/or Complexity of Data Reviewed Labs: ordered. Radiology: ordered.   Patient presents to the ED for concern of numbness and dizziness, this involves an extensive number of treatment options, and is a complaint that carries with it a high risk of complications and morbidity.  The differential diagnosis includes CVA/TIA, electrolyte abnormality, ACS   Co morbidities that complicate the patient evaluation  See hpi   Additional history obtained:  Additional history obtained from Nursing   External records from outside source obtained and reviewed including triage rn note   Lab Tests:  I Ordered, and personally interpreted labs.  The pertinent results include:   No leukocytosis nor anemia CBG 104 Creatinine 1.26 per his baseline Troponin 3   Imaging Studies ordered:  I ordered imaging studies including CT head, MRI I independently visualized and interpreted CT imaging which showed no acute intracranial abnormalities I agree with the radiologist interpretation MRI results pending   Cardiac  Monitoring:  The patient was maintained on a cardiac monitor.  I personally viewed and interpreted the cardiac monitored which  showed an underlying rhythm of: NSR    Problem List / ED Course:  Numbness Reported numbness "all over" Dizziness Does not appear to be positional in nature. Ordered orthostatics Neuro intact No current complaints of numbness, weakness, HA, nor dizziness No infectious complaints to include NVD, cough, congestion. No abdominal tenderness. Lung sounds CTAB so doubt intrabdominal nor intrathoracic abnormalities Will work up for acute encephalopathy, CVA/TIA, ACS. Could be related to polypharmacy   Reevaluation:  After the interventions noted above, I reevaluated the patient and found that they have :stayed the same    Dispostion:  Workup so far is reassuring.  No CVA derangement of electrolytes nor CBG.  UA is not infectious.  No leukocytosis.  Troponin WNL.  CT head does not show any acute intracranial abnormality.  Patient himself is neurologically intact and has no complaints.  Will obtain MRI and reassess patient.  Assuming that MRI is normal, patient can be discharged and follow-up with PCP for further management. Otherwise neuro may need to be consulted if MRI is abnormal. Sign out to [facility] PA  I discussed ED workup so far with patient and patient's family.  They expressed understanding agree with current plan to go for MRI.  Final Clinical Impression(s) / ED Diagnoses Final diagnoses:  None    Rx / DC Orders ED Discharge Orders     None         Royann Cords, PA 10/07/23 [id number]    Edson Graces, MD 10/07/23 [phone]

## 2023-10-07 NOTE — ED Notes (Signed)
 Pt in bed, family at bedside, pt is dressed and ready to go home, reviewed d/c instructions with pt and family, verbalized understanding, pt ambulatory from department with family

## 2023-10-07 NOTE — Discharge Instructions (Signed)
 Return if any problems.  Follow up with your neurologist for recheck

## 2023-10-07 NOTE — ED Triage Notes (Signed)
 PT brought in by EMS for weakness. Per EMS PT has advance stages of dementia/alzheimer's and is progressing. PT is at baseline/slightly worse per family. PT has no complaints and no pain at all. PT states all his symptoms are chronic from a previous stroke. PT VSS and alert and talking.

## 2023-10-07 NOTE — ED Provider Notes (Signed)
 Patient's care assumed by me at 6:30 AM.  Patient is pending MRI.  Patient has Alzheimer's and is having some increasing symptoms.  Patient brought here by family for evaluation. MRI reviewed no acute CVA no acute findings. Patient reevaluated he is anxious to go home.  He is here with family who will be with him.  Patient is advised to schedule follow-up with his neurologist.  Return to the emergency department if any problems   Brayley Mackowiak K, PA-C 10/07/23 0941    Albertus Hughs, DO 10/07/23 1236

## 2023-10-07 NOTE — ED Notes (Signed)
 Pt in bed, family at bedside, pt is oriented to person, place and time, pt is confused, pt repeats questions and has a broken thought process.  Pt denies pain, pt requests something for anxiety for MRI
